# Patient Record
Sex: Female | Born: 1980 | Race: White | Hispanic: No | Marital: Married | Smoking: Former smoker
Health system: Southern US, Community
[De-identification: ages and names within clinical notes are randomized; demographics above are authoritative.]

## PROBLEM LIST (undated history)

## (undated) DIAGNOSIS — E039 Hypothyroidism, unspecified: Secondary | ICD-10-CM

## (undated) DIAGNOSIS — K219 Gastro-esophageal reflux disease without esophagitis: Secondary | ICD-10-CM

## (undated) DIAGNOSIS — K589 Irritable bowel syndrome without diarrhea: Secondary | ICD-10-CM

## (undated) DIAGNOSIS — E063 Autoimmune thyroiditis: Secondary | ICD-10-CM

## (undated) DIAGNOSIS — E288 Other ovarian dysfunction: Secondary | ICD-10-CM

## (undated) DIAGNOSIS — E042 Nontoxic multinodular goiter: Secondary | ICD-10-CM

## (undated) DIAGNOSIS — E538 Deficiency of other specified B group vitamins: Secondary | ICD-10-CM

## (undated) DIAGNOSIS — D649 Anemia, unspecified: Secondary | ICD-10-CM

## (undated) DIAGNOSIS — E559 Vitamin D deficiency, unspecified: Secondary | ICD-10-CM

## (undated) HISTORY — DX: Other ovarian dysfunction: E28.8

## (undated) HISTORY — PX: DILATION AND CURETTAGE OF UTERUS: SHX78

## (undated) HISTORY — PX: BUNIONECTOMY: SHX129

## (undated) HISTORY — PX: WISDOM TOOTH EXTRACTION: SHX21

## (undated) HISTORY — DX: Autoimmune thyroiditis: E06.3

## (undated) HISTORY — DX: Irritable bowel syndrome without diarrhea: K58.9

## (undated) HISTORY — DX: Nontoxic multinodular goiter: E04.2

## (undated) HISTORY — PX: DIAGNOSTIC LAPAROSCOPY: SUR761

## (undated) HISTORY — DX: Deficiency of other specified B group vitamins: E53.8

## (undated) HISTORY — PX: CHOLECYSTECTOMY: SHX55

## (undated) HISTORY — DX: Vitamin D deficiency, unspecified: E55.9

## (undated) HISTORY — DX: Morbid (severe) obesity due to excess calories: E66.01

---
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
## 1999-11-24 ENCOUNTER — Other Ambulatory Visit: Admission: RE | Admit: 1999-11-24 | Discharge: 1999-11-24 | Payer: Self-pay | Admitting: Obstetrics and Gynecology

## 2002-02-20 ENCOUNTER — Other Ambulatory Visit: Admission: RE | Admit: 2002-02-20 | Discharge: 2002-02-20 | Payer: Self-pay | Admitting: Obstetrics and Gynecology

## 2002-08-08 ENCOUNTER — Encounter: Admission: RE | Admit: 2002-08-08 | Discharge: 2002-08-08 | Payer: Self-pay | Admitting: Internal Medicine

## 2002-08-08 ENCOUNTER — Encounter (HOSPITAL_BASED_OUTPATIENT_CLINIC_OR_DEPARTMENT_OTHER): Payer: Self-pay | Admitting: Internal Medicine

## 2003-03-20 ENCOUNTER — Encounter: Admission: RE | Admit: 2003-03-20 | Discharge: 2003-03-20 | Payer: Self-pay | Admitting: *Deleted

## 2003-03-20 ENCOUNTER — Encounter: Payer: Self-pay | Admitting: *Deleted

## 2003-04-05 ENCOUNTER — Other Ambulatory Visit: Admission: RE | Admit: 2003-04-05 | Discharge: 2003-04-05 | Payer: Self-pay | Admitting: Obstetrics and Gynecology

## 2004-01-02 ENCOUNTER — Ambulatory Visit (HOSPITAL_COMMUNITY): Admission: RE | Admit: 2004-01-02 | Discharge: 2004-01-02 | Payer: Self-pay | Admitting: Internal Medicine

## 2004-01-21 ENCOUNTER — Encounter (INDEPENDENT_AMBULATORY_CARE_PROVIDER_SITE_OTHER): Payer: Self-pay | Admitting: Specialist

## 2004-01-21 ENCOUNTER — Ambulatory Visit (HOSPITAL_COMMUNITY): Admission: RE | Admit: 2004-01-21 | Discharge: 2004-01-21 | Payer: Self-pay | Admitting: General Surgery

## 2004-05-25 ENCOUNTER — Other Ambulatory Visit: Admission: RE | Admit: 2004-05-25 | Discharge: 2004-05-25 | Payer: Self-pay | Admitting: Obstetrics and Gynecology

## 2004-07-24 ENCOUNTER — Ambulatory Visit (HOSPITAL_COMMUNITY): Admission: RE | Admit: 2004-07-24 | Discharge: 2004-07-24 | Payer: Self-pay

## 2005-04-15 ENCOUNTER — Other Ambulatory Visit: Admission: RE | Admit: 2005-04-15 | Discharge: 2005-04-15 | Payer: Self-pay | Admitting: Obstetrics and Gynecology

## 2005-09-13 ENCOUNTER — Inpatient Hospital Stay (HOSPITAL_COMMUNITY): Admission: AD | Admit: 2005-09-13 | Discharge: 2005-09-13 | Payer: Self-pay | Admitting: Obstetrics and Gynecology

## 2005-09-20 ENCOUNTER — Encounter (INDEPENDENT_AMBULATORY_CARE_PROVIDER_SITE_OTHER): Payer: Self-pay | Admitting: *Deleted

## 2005-09-20 ENCOUNTER — Inpatient Hospital Stay (HOSPITAL_COMMUNITY): Admission: AD | Admit: 2005-09-20 | Discharge: 2005-09-23 | Payer: Self-pay | Admitting: Obstetrics and Gynecology

## 2006-02-22 ENCOUNTER — Emergency Department (HOSPITAL_COMMUNITY): Admission: EM | Admit: 2006-02-22 | Discharge: 2006-02-23 | Payer: Self-pay | Admitting: *Deleted

## 2006-12-09 ENCOUNTER — Other Ambulatory Visit: Admission: RE | Admit: 2006-12-09 | Discharge: 2006-12-09 | Payer: Self-pay | Admitting: Obstetrics and Gynecology

## 2007-07-16 ENCOUNTER — Encounter: Admission: RE | Admit: 2007-07-16 | Discharge: 2007-07-16 | Payer: Self-pay | Admitting: *Deleted

## 2007-10-27 ENCOUNTER — Ambulatory Visit (HOSPITAL_COMMUNITY): Admission: RE | Admit: 2007-10-27 | Discharge: 2007-10-27 | Payer: Self-pay | Admitting: Family Medicine

## 2007-12-04 ENCOUNTER — Other Ambulatory Visit: Admission: RE | Admit: 2007-12-04 | Discharge: 2007-12-04 | Payer: Self-pay | Admitting: Obstetrics and Gynecology

## 2008-06-24 ENCOUNTER — Inpatient Hospital Stay (HOSPITAL_COMMUNITY): Admission: EM | Admit: 2008-06-24 | Discharge: 2008-06-26 | Payer: Self-pay | Admitting: Obstetrics and Gynecology

## 2009-01-09 ENCOUNTER — Encounter: Admission: RE | Admit: 2009-01-09 | Discharge: 2009-01-09 | Payer: Self-pay | Admitting: Family Medicine

## 2011-02-03 ENCOUNTER — Other Ambulatory Visit: Payer: Self-pay | Admitting: Internal Medicine

## 2011-02-03 ENCOUNTER — Other Ambulatory Visit: Payer: Self-pay | Admitting: *Deleted

## 2011-02-03 DIAGNOSIS — E049 Nontoxic goiter, unspecified: Secondary | ICD-10-CM

## 2011-02-03 DIAGNOSIS — E063 Autoimmune thyroiditis: Secondary | ICD-10-CM

## 2011-02-03 DIAGNOSIS — E039 Hypothyroidism, unspecified: Secondary | ICD-10-CM

## 2011-02-11 ENCOUNTER — Other Ambulatory Visit: Payer: Self-pay | Admitting: Obstetrics and Gynecology

## 2011-02-11 ENCOUNTER — Other Ambulatory Visit (HOSPITAL_COMMUNITY)
Admission: RE | Admit: 2011-02-11 | Discharge: 2011-02-11 | Disposition: A | Discharge status: Home / Self Care | Payer: PRIVATE HEALTH INSURANCE | Source: Ambulatory Visit | Attending: Obstetrics and Gynecology | Admitting: Obstetrics and Gynecology

## 2011-02-11 DIAGNOSIS — Z01419 Encounter for gynecological examination (general) (routine) without abnormal findings: Secondary | ICD-10-CM | POA: Insufficient documentation

## 2011-02-19 ENCOUNTER — Ambulatory Visit
Admission: RE | Admit: 2011-02-19 | Discharge: 2011-02-19 | Disposition: A | Discharge status: Home / Self Care | Payer: PRIVATE HEALTH INSURANCE | Source: Ambulatory Visit | Attending: *Deleted | Admitting: *Deleted

## 2011-02-19 ENCOUNTER — Other Ambulatory Visit: Payer: Self-pay | Admitting: Internal Medicine

## 2011-02-19 DIAGNOSIS — E049 Nontoxic goiter, unspecified: Secondary | ICD-10-CM

## 2011-02-19 DIAGNOSIS — E039 Hypothyroidism, unspecified: Secondary | ICD-10-CM

## 2011-02-19 DIAGNOSIS — E063 Autoimmune thyroiditis: Secondary | ICD-10-CM

## 2011-02-22 ENCOUNTER — Other Ambulatory Visit: Payer: Self-pay | Admitting: Internal Medicine

## 2011-02-22 DIAGNOSIS — E063 Autoimmune thyroiditis: Secondary | ICD-10-CM

## 2011-02-22 DIAGNOSIS — E049 Nontoxic goiter, unspecified: Secondary | ICD-10-CM

## 2011-02-22 DIAGNOSIS — E039 Hypothyroidism, unspecified: Secondary | ICD-10-CM

## 2011-02-23 NOTE — Op Note (Signed)
NAMEASHRITHA, DESROSIERS NO.:  [id number]   MEDICAL RECORD NO.:  [id number]          PATIENT TYPE:  INP   LOCATION:  9118                          FACILITY:  [facility]   PHYSICIAN:  Charles A. Delcambre, MDDATE OF BIRTH:  [birth year]   DATE OF PROCEDURE:  06/24/2008  DATE OF DISCHARGE:                               OPERATIVE REPORT   PREOPERATIVE DIAGNOSES:  1. Intrauterine pregnancy at 39 weeks.  2. For a repeat cesarean section.  3. Right groin abscess.   POSTOPERATIVE DIAGNOSES:  1. Intrauterine pregnancy at 39 weeks.  2. For a repeat cesarean section  3. Right groin abscess.   PROCEDURE:  1. Repeat low-transverse cesarean section.  2. Incision and drainage and packing of vulvar abscess.   SURGEON:  Charles A. Sydnee Cabal, MD   ASSISTANT:  Gerald Leitz, MD   COMPLICATIONS:  None.   ESTIMATED BLOOD LOSS:  700 mL.   ANESTHESIA:  Spinal.   SPECIMENS:  Placenta to Labor and Delivery.   OPERATIVE FINDINGS:  Vigorous female, Apgars 9 and 9, 7 pounds and 7  ounces.   Instrument, sponge, and needle count correct x2.   DESCRIPTION OF PROCEDURE:  The patient was taken to the operating room,  placed in supine position after spinal was induced, and adequate sterile  prep and drape was undertaken, a Pfannenstiel incision was made with a  knife and carried down to fascia.  Fascia was incised with a knife and  Mayo scissors.  Rectus sheath was released superiorly and inferiorly,  sharply.  Peritoneum was entered.  During this procedure, there was no  damage to bowel, bladder, or vascular structures.  A small omental  adhesion at the superior edge of the fascial dissection was taken down  with bipolar cautery, and this released the minimal adhesion without  difficulty and with good hemostasis.  Rectus sheath was taken down  inferiorly and bladder was taken down sharply with a bladder blade in  place.  There was no evidence of injury to the bladder.  Bladder flap  was  then mobilized as noted and Alexis retractor was placed and was  swept.  No bowel was caught.  Surgery now went well and adequate  exposure was noted.  Lower uterine segment transverse incision was made  with a knife to amniotomy.  There was no damage to the infant.  Traction  was used to extend the incision.  Clear amniotic fluid was noted.  Vertex was lifted to the uterine incision.  Fundal pressure by the  operator assistant delivered the baby without difficulty.  The infant  was vigorous with findings as noted above.  The cord was cut and the  infant was shown to the parents and then taken to the neonatologist, who  was in attendance.  Placenta was manually extracted secondary to some  mild adhesions.  This was sent to [facility] and then to Labor  and Delivery.  Placental site was clean and of good hemostasis.  Internal surface of the uterus was wiped with a moistened lap.  Uterus  was then closed in 2 layers, #1 chromic running-locking, then #1  chromic  imbricating for most part of the case and locking 2 figure-of-eight  sutures were used in the midline to achieve hemostasis, figure-of-eight  of 2-0 Vicryl was used, and final hemostasis was excellent.  Irrigation  was carried out.  Hemostasis was verified.  Subfascial hemostasis was  excellent after removal of the Alexis retractor.  Fascia was then closed  with a running-nonlocking suture, subcutaneous 2-0 Vicryl was used to  close the subcutaneous layer after irrigation with minor electrocautery,  sterile skin clips were placed, sterile dressing was applied, drapes  were removed, and the area of the abscess and the groin were then  isolated with sterile towels.  This had been prepped earlier and was re-  prepped with Betadine.  I would treat her with Bactrim postoperatively  presumably for MRSA, although culture was not done.  MRSA would be  treated with this regimen as well, and we will keep her on Bactrim for 2  weeks.   Incision was made to open about approximately three-quarters of  an inch.  Abscess was about 2 cm deep.  Loculations were broken up with  a hemostat.  An half-inch plain gauze was then placed in packing.  We  will change the packing once a day until healed primarily.  The patient  is informed.  Dressing was applied over the gauze.  She tolerated the  procedure well and was taken to recovery with the physician in  attendance.      Charles A. Sydnee Cabal, MD  Electronically Signed     CAD/MEDQ  D:  06/24/2008  T:  06/25/2008  Job:  [id number]

## 2011-02-23 NOTE — H&P (Signed)
[name], FILLINGIM NO.:  [id number]   MEDICAL RECORD NO.:  [id number]          PATIENT TYPE:  INP   LOCATION:                                [facility]  [facility]   PHYSICIAN:  Charles A. Delcambre, MDDATE OF BIRTH:  1980-12-23   DATE OF ADMISSION:  07/05/2008  DATE OF DISCHARGE:                              HISTORY & PHYSICAL   This patient will be admitted on June 25, 2008, to undergo repeat  cesarean section.  [facility] July 01, 2008.  She will be admitted on  June 24, 2008, as noted.  Her pregnancy has been complicated by  hypothyroidism.  She is gravida 4, para 1-0-2-1, 1 ectopic.   PAST MEDICAL HISTORY:  Hashimoto thyroiditis, irritable bowel syndrome,  currently only hypothyroidism.   SURGICAL HISTORY:  Cholecystectomy, bunionectomy, D&C for miscarriage at  8 weeks, cesarean section low transverse.   MEDICATIONS:  Synthroid 50 mcg has been discontinued during the  pregnancy.  Followup TSH was normal.   ALLERGIES:  PENICILLIN, reaction not specified.   SOCIAL HISTORY:  No tobacco, ethanol, or drug use.  She is monogamous  with her husband.   FAMILY HISTORY:  Ovarian cancer in maternal grandmother, paternal  grandfather with colon cancer, otherwise no major illnesses.   REVIEW OF SYSTEMS:  Notes active fetal movement.  Denies fever, chills,  nausea, vomiting, diarrhea, constipation, rupture of membranes,  bleeding, or current contractions.   PHYSICAL EXAMINATION:  GENERAL:  Alert and oriented x3.  VITAL SIGNS:  Blood pressure 110/78, respirations 18, pulse 90, weight  202 pounds, and height 5 feet and 6-3/4 inches.  LUNGS:  Clear bilaterally.  HEART:  Regular rate and rhythm, 2/6 systolic ejection murmur at left  sternal border.  ABDOMEN:  Gravid.  Fundal height 38 cm today at date of dictation which  is June 10, 2008.  PELVIC:  Deferred.  EXTREMITIES:  Nontender.   ASSESSMENT:  Intrauterine pregnancy to be 39 weeks and 1 day upon  admission for repeat cesarean section.  She gives informed consent as  noted above.  All questions were answered.  All questions were answered.  We will proceed as outlined.  Preoperative CBC, TSH, type and screen,  and RPR.  Group B strep has been done is pending at  this time.  Questions were answered.  We will proceed as outlined.  She  accepts risks of infection, bleeding, bowel and bladder damage, blood  product risk including hepatitis and HIV exposure, DVT, incision  infection, ureteral damage.  Gives informed consent.      Charles A. Sydnee Cabal, MD  Electronically Signed     CAD/MEDQ  D:  06/10/2008  T:  06/11/2008  Job:  [id number]

## 2011-02-26 NOTE — Op Note (Signed)
[name], [name]                       ACCOUNT NO.:  [id number]   MEDICAL RECORD NO.:  [id number]                   PATIENT TYPE:  AMB   LOCATION:  DAY                                  FACILITY:  [facility]   PHYSICIAN:  Timothy E. Earlene Plater, M.D.              DATE OF BIRTH:  [birth year]   DATE OF PROCEDURE:  01/21/2004  DATE OF DISCHARGE:  01/21/2004                                 OPERATIVE REPORT   PREOPERATIVE DIAGNOSIS:  Cholecystolithiasis.   POSTOPERATIVE DIAGNOSIS:  Cholecystolithiasis.   PROCEDURE:  Laparoscopic cholecystectomy and operative cholangiogram.   SURGEON:  Kendrick Ranch, M.D.   ASSISTANT:  Thornton Dales, M.D.   ANESTHESIA:  General.   INDICATIONS FOR PROCEDURE:  [name] is otherwise healthy, 23, newly  diagnosed after her first acute attack of cholecystolithiasis.  She has been  well-prepared and is ready to proceed.  She is seen, identified, and the  permit signed.  Laboratory data are satisfactorily normal.   DESCRIPTION OF PROCEDURE:  She was taken to the operating room, placed  supine, general endotracheal anesthesia administered.  PAS hose, oral  gastric tube, appropriate monitors applied.  The abdomen was prepped and  draped in the usual fashion.  And prior to each incision, 0.25% Marcaine  with epinephrine is used.  A vertical infraumbilical incision made, the  fascia identified, opened vertically, peritoneum entered without  complications.  Hasson catheter placed, tied in place with a #1 Vicryl.  The  abdomen insufflated, and general peritoneoscopy was unremarkable.  A second  10 mm trocar placed in the mid epigastrium and two 5 mm trocars in the right  upper quadrant.  The gallbladder was normal in its overall appearance.  It  was grasped, placed on tension.  Careful dissection at the base of the  gallbladder revealed a normal-appearing cystic duct entering the  gallbladder.  This was dissected free as was the artery just anterior and a  complete  window created.  Once this had been done, a clip was placed near  the gallbladder on the cystic duct.  Then, an incision was made in the  cystic duct and the cholangiogram catheter was placed percutaneously and  placed into the cystic duct.  Cholangiography carried out with real-time  fluoroscopy and appeared normal.  The clip and the catheter removed, the  stump of the cystic duct triply clipped and divided.  The artery triply  clipped and divided.  The gallbladder was removed from the gallbladder bed  without incident or complication.  The gallbladder bed was dry.  Irrigant  was clear.  The gallbladder removed through the infraumbilical incision  which was closed and tied with a #1 Vicryl with an additional stitch placed  under direct vision.  Further irrigation carried out and was clear and all  irrigant, CO2, instruments, and trocars removed.  Counts were correct.  She  had tolerated it well.  The skin incisions were inspected and closed  with 4-  0 Monocryl.  Steri-Strips applied, dry sterile dressing.  Final counts  correct.  She was awakened and taken to the recovery room in good condition.                                              Timothy E. Earlene Plater, M.D.   TED/MEDQ  D:  01/23/2004  T:  01/23/2004  Job:  [id number]

## 2011-02-26 NOTE — Discharge Summary (Signed)
NAMEHENRITTA, [name] NO.:  [id number]   MEDICAL RECORD NO.:  [id number]          PATIENT TYPE:  INP   LOCATION:  9118                          [facility]ACILITY[facility]  [facility]   PHYSICIAN:  Charles A. Delcambre, MDDATE OF BIRTH:  07-13-1981   DATE OF ADMISSION:  06/24/2008  DATE OF DISCHARGE:  06/26/2008                               DISCHARGE SUMMARY   DISCHARGE DIAGNOSES:  1. Intrauterine pregnancy at 39 weeks.  2. Repeat cesarean section.  3. Right groin boil/abscess.   PROCEDURES:  1. Repeat low transverse cesarean section.  2. Incision and drainage and packing of the right bulbar boil/abscess.   FINDINGS:  Vigorous female, Apgars 9 and 9, and 7 pounds 7 ounces.   HISTORY AND PHYSICAL:  As dictated on the chart.   LABORATORY DATA:  Postoperative hematocrit 28.7, hemoglobin 9.9.  Culture of the abscess was not done and that we would empirically treat  with Bactrim to cover MRSA as it will be found in approximately 30% of  vulvar abscess as per literature.   PATHOLOGY:  Placenta to labor and delivery.   DISPOSITION:  The patient was discharged home to follow up in the office  in 3 days to undergo a staple discontinuation.  She was given Percocet  5/325 one to two p.o. q.4 h. p.r.n. #40 and Motrin 800 mg 1 p.o. q.8 h.  p.r.n. #30 refill x1.  She was given convalescent instructions.  No  driving for 2 weeks.  Shower okay for 2 weeks.  No bath for 2 weeks.  No  vaginal intercourse for 6 weeks.  No lifting greater than 20 pounds for  4 weeks.  Notify if temperature greater than 100, increasing pain, or  bleeding.  She was given a prescription of Bactrim DS 1 p.o. b.i.d. for  2 weeks for 21 days as well.   HOSPITAL COURSE:  The patient was admitted and underwent surgery as  noted above.  On postop day #1 she did well with Duramorph and went to  pain medication p.o.  She had spontaneous flatus return without  difficulty.  The abscess incision was packed by the nursing  personnel on  postop day #2.  Packing was accomplished as well and she strongly  desired to be discharged to home and was discharged to home for that  reason on postop day #2.      Charles A. Sydnee Cabal, MD  Electronically Signed     CAD/MEDQ  D:  07/18/2008  T:  07/19/2008  Job:  [id number]

## 2011-02-26 NOTE — H&P (Signed)
[name], AILES NO.:  [id number]   MEDICAL RECORD NO.:  [id number]           PATIENT TYPE:   LOCATION:                                 FACILITY:   PHYSICIAN:  Charles A. Delcambre, MDDATE OF BIRTH:  February 03, 1981   DATE OF ADMISSION:  09/20/2005  DATE OF DISCHARGE:                                HISTORY & PHYSICAL   She is a 29 year old para 0-0-1-0, [facility] September 12, 2005 at 41 weeks and 1  day estimated gestational age to be induced on the 11th.  Pregnancy has been  uncomplicated except for GERD for which she is on Protonix.  Pregnancy is  documented by LMP, but confirmed with pregnancy ultrasound early in the  pregnancy at 8 weeks.  With discussion with the patient feeling best  estimate of date of confinement September 12, 2005.   PAST MEDICAL HISTORY:  1.  History of Hashimoto's thyroiditis.  2.  Irritable bowel syndrome.   PAST SURGICAL HISTORY:  1.  Cholecystectomy.  2.  Bunionectomy.  3.  D&C for miscarriage 8 weeks.   MEDICATIONS:  Protonix, prenatal vitamins.   ALLERGIES:  PENICILLIN, reaction not specified.   SOCIAL HISTORY:  No tobacco, ethanol, or drug use.  Patient is married, in  monogamous relationship with her husband.   FAMILY HISTORY:  Maternal grandmother ovarian cancer.  Maternal grandfather  colon cancer.  Otherwise, no major medical illnesses.   REVIEW OF SYSTEMS:  Active fetal movement.  No ruptured membranes or  bleeding.  Nausea, vomiting, constipation, bleeding, dysuria.   PHYSICAL EXAMINATION:  GENERAL:  Alert and oriented x3.  VITAL SIGNS:  Blood pressure 122/70, weight 205 pounds.  HEENT:  Grossly within normal limits.  NECK:  Supple without thyromegaly or adenopathy.  BACK:  No CVAT.  LUNGS:  Clear bilaterally.  HEART:  Regular rate and rhythm.  2/6 systolic ejection murmur left sternal  border.  BREASTS:  No masses, tenderness, discharge, skin or nipple change  bilaterally.  ABDOMEN:  Gravid.  Fundal height 39 cm.   Estimated fetal weight 3800 g.  Vertex to Leopold's.  Fetal heart rate 150s.  PELVIC:  Normal external female genitalia.  Bartholin's, urethral, Skene's  within normal limits.  Vault clean.  Cervix anterior, 3 cm dilated, 75%  effaced, -1 station, vertex intact.  Membranes are stripped at patient  request.  EXTREMITIES:  Minimal edema bilaterally, nontender bilaterally.   PRENATAL LABORATORIES:  28-week hemoglobin at 12.5, one-hour Glucola 69.  36-  week group B Strep negative.  Blood type O+.  Antibody screen negative.  VDRL negative.  Rubella immune.  Hepatitis B surface antigen negative.  HIV  nonreactive.  TSH normal.  GC, Chlamydia cultures negative.  Pap negative.  Cystic fibrosis negative.   ASSESSMENT:  Intrauterine pregnancy 41 weeks 1 day.   PLAN:  Induction on September 20, 2005.  Will plan Pitocin induction with  artificial rupture of membranes.  Patient is in agreement and will follow up  as directed.      Charles A. Sydnee Cabal, MD  Electronically Signed     CAD/MEDQ  D:  09/16/2005  T:  09/16/2005  Job:  [id number]

## 2011-02-26 NOTE — Op Note (Signed)
NAMEJAYLAH, [name] NO.:  [id number]   MEDICAL RECORD NO.:  [id number]          PATIENT TYPE:  INP   LOCATION:  9103                          FACILITY:  [facility]   PHYSICIAN:  Charles A. Delcambre, MDDATE OF BIRTH:  September 06, 1981   DATE OF PROCEDURE:  09/20/2005  DATE OF DISCHARGE:                                 OPERATIVE REPORT   PREOPERATIVE DIAGNOSES:  1.  Intrauterine pregnancy 41 weeks and 1 day.  2.  Fetal intolerance of labor in the second stage.  3.  Meconium.   POSTOPERATIVE DIAGNOSES:  1.  Intrauterine pregnancy 41 weeks and 1 day.  2.  Fetal intolerance of labor in the second stage.  3.  Meconium.   OPERATION/PROCEDURE:  Primary low transverse cesarean section.   SURGEON:  Charles A. Sydnee Cabal, M.D.   ASSISTANT:  None.   COMPLICATIONS:  Meconium.   ANESTHESIA:  Epidural.   SPECIMENS:  Placenta to pathology.  Cord arterial blood gas pH 7.16.  Venous  blood gas from the cord pH 7.23.  Vigorous female, Apgars 9 and 9, 8 pounds  0 ounces.   ESTIMATED BLOOD LOSS:  500 mL.   COUNTS:  Instrument, sponge and needle counts correct x2.   DESCRIPTION OF PROCEDURE:  The patient was taken to the operating room,  placed in the supine position.  Epidural was dosed and it was found to be  adequate.  Sterile prep and drape was undertaken.  A Pfannenstiel incision  was made with the knife and carried down to the fascia.  The fascia was  incised with the knife and Mayo scissors.  Rectus sheath was released  superiorly and inferiorly sharply.  Rectus muscle was sharply dissected in  the midline.  Peritoneum was entered with hemostat.  Traction was used to  extend this incision.  Vesicouterine peritoneum was incised.  Blunt  dissection was used to develop the bladder flap.  Bladder blade was placed.  Lower uterine segment transverse incision was taken down.  Amniotomy was  made with the knife.  There  was no damage to the infant.  Traction was used  to extend  the incision.  Hand was inserted.  Occiput was lifted through the  incision uterine incision site.  Fundal pressure was applied by the operator  and assistant and infant was delivered without difficulty.  DeLee suction  was applied with delivery of the head.  The infant was vigorous and cried  immediately after delivery of the shoulders.  Further suction was done with  DeLee and bulb suction.  The infant was shown to the parents after the cord  was cut and given to the neonatologist who was present.  Cord gases were  taken.  Cord blood was taken.  Uterus was massaged and placenta was manually  expressed and handed off to go to pathology and for cord blood registry to  be taken per the patient's request.   Uterus was externalized for repair.  Inside the uterus was wiped with a  moist lap.  Uterus was then closed in running locking single layer closure  of #1 chromic. There was one area  of bleeding in the midline and this was  closed further with #1 chromic figure-of-eight for good hemostasis  resulting.  Irrigation was carried out.  Uterus was reinternalized.  Pericolic gutters and bladder flap areas were irrigated and cleansed of  blood and clots.  Hemostasis was verified at the uterine incision site and  the bladder flap.  Hemostasis was excellent.  Subfascial hemostasis was  excellent.  Fascia was then closed with #1 Vicryl running, non-locking  suture.  Subcutaneous 2-0 plain gut interrupted sutures to close the  subcutaneous layer.  Sterile skin clips were used to close the skin.  Sterile dressing was applied.  The patient was taken to recovery with the  physician in attendance having tolerated the procedure well.      Charles A. Sydnee Cabal, MD  Electronically Signed     CAD/MEDQ  D:  09/20/2005  T:  09/21/2005  Job:  [id number]

## 2011-07-12 LAB — CBC
HCT: 28.7 — ABNORMAL LOW
Hemoglobin: 9.9 — ABNORMAL LOW
MCHC: 34.6
MCV: 95.9
WBC: 12.4 — ABNORMAL HIGH

## 2011-07-14 LAB — CBC
Hemoglobin: 12.3
MCHC: 34.3
Platelets: 229
WBC: 10.8 — ABNORMAL HIGH

## 2011-07-14 LAB — RPR: RPR Ser Ql: NONREACTIVE

## 2011-07-14 LAB — ABO/RH: ABO/RH(D): O POS

## 2013-03-15 ENCOUNTER — Other Ambulatory Visit (HOSPITAL_COMMUNITY)
Admission: RE | Admit: 2013-03-15 | Discharge: 2013-03-15 | Disposition: A | Discharge status: Home / Self Care | Payer: BC Managed Care – PPO | Source: Ambulatory Visit | Attending: Obstetrics and Gynecology | Admitting: Obstetrics and Gynecology

## 2013-03-15 ENCOUNTER — Other Ambulatory Visit: Payer: Self-pay | Admitting: Obstetrics and Gynecology

## 2013-03-15 DIAGNOSIS — Z01419 Encounter for gynecological examination (general) (routine) without abnormal findings: Secondary | ICD-10-CM | POA: Insufficient documentation

## 2013-03-15 DIAGNOSIS — Z113 Encounter for screening for infections with a predominantly sexual mode of transmission: Secondary | ICD-10-CM | POA: Insufficient documentation

## 2013-03-15 DIAGNOSIS — Z1151 Encounter for screening for human papillomavirus (HPV): Secondary | ICD-10-CM | POA: Insufficient documentation

## 2013-03-15 LAB — OB RESULTS CONSOLE RPR: RPR: NONREACTIVE

## 2013-03-15 LAB — OB RESULTS CONSOLE ABO/RH: RH TYPE: POSITIVE

## 2013-03-15 LAB — OB RESULTS CONSOLE ANTIBODY SCREEN: Antibody Screen: NEGATIVE

## 2013-04-26 LAB — OB RESULTS CONSOLE HIV ANTIBODY (ROUTINE TESTING): HIV: NONREACTIVE

## 2013-10-22 LAB — OB RESULTS CONSOLE GBS: STREP GROUP B AG: NEGATIVE

## 2013-10-30 ENCOUNTER — Encounter (HOSPITAL_COMMUNITY): Payer: Self-pay | Admitting: Pharmacist

## 2013-11-08 ENCOUNTER — Encounter (HOSPITAL_COMMUNITY): Payer: Self-pay

## 2013-11-09 ENCOUNTER — Other Ambulatory Visit: Payer: Self-pay | Admitting: Obstetrics and Gynecology

## 2013-11-12 ENCOUNTER — Encounter (HOSPITAL_COMMUNITY): Payer: Self-pay

## 2013-11-12 ENCOUNTER — Other Ambulatory Visit: Payer: Self-pay | Admitting: Obstetrics and Gynecology

## 2013-11-12 ENCOUNTER — Encounter (HOSPITAL_COMMUNITY)
Admission: RE | Admit: 2013-11-12 | Discharge: 2013-11-12 | Disposition: A | Discharge status: Home / Self Care | Payer: BC Managed Care – PPO | Source: Ambulatory Visit | Attending: Obstetrics and Gynecology | Admitting: Obstetrics and Gynecology

## 2013-11-12 ENCOUNTER — Encounter (HOSPITAL_COMMUNITY): Payer: Self-pay | Admitting: Anesthesiology

## 2013-11-12 HISTORY — DX: Gastro-esophageal reflux disease without esophagitis: K21.9

## 2013-11-12 HISTORY — DX: Hypothyroidism, unspecified: E03.9

## 2013-11-12 HISTORY — DX: Anemia, unspecified: D64.9

## 2013-11-12 LAB — CBC
HCT: 35 % — ABNORMAL LOW (ref 36.0–46.0)
Hemoglobin: 11.6 g/dL — ABNORMAL LOW (ref 12.0–15.0)
MCH: 31.3 pg (ref 26.0–34.0)
MCHC: 33.1 g/dL (ref 30.0–36.0)
MCV: 94.3 fL (ref 78.0–100.0)
PLATELETS: 226 10*3/uL (ref 150–400)
RBC: 3.71 MIL/uL — ABNORMAL LOW (ref 3.87–5.11)
RDW: 15.5 % (ref 11.5–15.5)
WBC: 9.8 10*3/uL (ref 4.0–10.5)

## 2013-11-12 LAB — RPR: RPR Ser Ql: NONREACTIVE

## 2013-11-12 MED ORDER — GENTAMICIN SULFATE 40 MG/ML IJ SOLN
INTRAMUSCULAR | Status: AC
  Administered 2013-11-13: 100 mL via INTRAVENOUS
  Filled 2013-11-12: qty 10.5

## 2013-11-12 NOTE — Patient Instructions (Addendum)
   Your procedure is scheduled on: Tuesday, Feb 3  [facility] at:  6 AM Pick up the phone at the desk and dial [phone] and inform us of your arrival.  Please call this number if you have any problems the morning of surgery: [phone]  Remember: Do not eat or drink after midnight: Monday Take these medicines the morning of surgery with a SIP OF WATER: synthroid, nexium  Do not wear jewelry, make-up, or FINGER nail polish No metal in your hair or on your body. Do not wear lotions, powders, perfumes.  You may wear deodorant.  Do not bring valuables to the hospital. Contacts, dentures or bridgework may not be worn into surgery.  Leave suitcase in the car. After Surgery it may be brought to your room. For patients being admitted to the hospital, checkout time is 11:00am the day of discharge.  Home with husband Tiffany [name] cell [phone]

## 2013-11-12 NOTE — Anesthesia Preprocedure Evaluation (Addendum)
Anesthesia Evaluation  Patient identified by MRN, date of birth, ID band Patient awake    Reviewed: Allergy & Precautions, H&P , NPO status , Patient's Chart, lab work & pertinent test results  Airway Mallampati: II TM Distance: >3 FB Neck ROM: Full    Dental no notable dental hx. (+) Teeth Intact   Pulmonary former smoker,  breath sounds clear to auscultation  Pulmonary exam normal       Cardiovascular negative cardio ROS  Rhythm:Regular Rate:Normal - Carotid Bruit    Neuro/Psych negative neurological ROS  negative psych ROS   GI/Hepatic Neg liver ROS, GERD-  Medicated and Controlled,  Endo/Other  Hypothyroidism Morbid obesity  Renal/GU negative Renal ROS  negative genitourinary   Musculoskeletal negative musculoskeletal ROS (+)   Abdominal (+) + obese,   Peds  Hematology  (+) anemia ,   Anesthesia Other Findings   Reproductive/Obstetrics (+) Pregnancy Previous C/Section                          Anesthesia Physical Anesthesia Plan  ASA: III  Anesthesia Plan: Spinal   Post-op Pain Management:    Induction:   Airway Management Planned: Natural Airway  Additional Equipment:   Intra-op Plan:   Post-operative Plan:   Informed Consent: I have reviewed the patients History and Physical, chart, labs and discussed the procedure including the risks, benefits and alternatives for the proposed anesthesia with the patient or authorized representative who has indicated his/her understanding and acceptance.     Plan Discussed with: CRNA, Anesthesiologist and Surgeon  Anesthesia Plan Comments:         Anesthesia Quick Evaluation

## 2013-11-13 ENCOUNTER — Encounter (HOSPITAL_COMMUNITY): Payer: BC Managed Care – PPO | Admitting: Anesthesiology

## 2013-11-13 ENCOUNTER — Encounter (HOSPITAL_COMMUNITY): Payer: Self-pay | Admitting: *Deleted

## 2013-11-13 ENCOUNTER — Encounter (HOSPITAL_COMMUNITY)
Admission: RE | Disposition: A | Discharge status: Home / Self Care | Payer: Self-pay | Source: Ambulatory Visit | Attending: Obstetrics and Gynecology

## 2013-11-13 ENCOUNTER — Inpatient Hospital Stay (HOSPITAL_COMMUNITY)
Admission: AD | Admit: 2013-11-13 | Payer: PRIVATE HEALTH INSURANCE | Source: Ambulatory Visit | Admitting: Obstetrics and Gynecology

## 2013-11-13 ENCOUNTER — Inpatient Hospital Stay (HOSPITAL_COMMUNITY): Payer: BC Managed Care – PPO | Admitting: Anesthesiology

## 2013-11-13 ENCOUNTER — Inpatient Hospital Stay (HOSPITAL_COMMUNITY)
Admission: RE | Admit: 2013-11-13 | Discharge: 2013-11-15 | DRG: 766 | Disposition: A | Discharge status: Home / Self Care | Payer: BC Managed Care – PPO | Source: Ambulatory Visit | Attending: Obstetrics and Gynecology | Admitting: Obstetrics and Gynecology

## 2013-11-13 DIAGNOSIS — Z87891 Personal history of nicotine dependence: Secondary | ICD-10-CM

## 2013-11-13 DIAGNOSIS — O34219 Maternal care for unspecified type scar from previous cesarean delivery: Principal | ICD-10-CM | POA: Diagnosis present

## 2013-11-13 DIAGNOSIS — K219 Gastro-esophageal reflux disease without esophagitis: Secondary | ICD-10-CM | POA: Diagnosis present

## 2013-11-13 DIAGNOSIS — E039 Hypothyroidism, unspecified: Secondary | ICD-10-CM | POA: Diagnosis present

## 2013-11-13 DIAGNOSIS — O99214 Obesity complicating childbirth: Secondary | ICD-10-CM

## 2013-11-13 DIAGNOSIS — D649 Anemia, unspecified: Secondary | ICD-10-CM | POA: Diagnosis present

## 2013-11-13 DIAGNOSIS — O9902 Anemia complicating childbirth: Secondary | ICD-10-CM | POA: Diagnosis present

## 2013-11-13 DIAGNOSIS — E669 Obesity, unspecified: Secondary | ICD-10-CM | POA: Diagnosis present

## 2013-11-13 DIAGNOSIS — Z98891 History of uterine scar from previous surgery: Secondary | ICD-10-CM

## 2013-11-13 DIAGNOSIS — O99284 Endocrine, nutritional and metabolic diseases complicating childbirth: Secondary | ICD-10-CM

## 2013-11-13 DIAGNOSIS — E079 Disorder of thyroid, unspecified: Secondary | ICD-10-CM | POA: Diagnosis present

## 2013-11-13 DIAGNOSIS — Z302 Encounter for sterilization: Secondary | ICD-10-CM

## 2013-11-13 LAB — PREPARE RBC (CROSSMATCH)

## 2013-11-13 SURGERY — Surgical Case
Anesthesia: Spinal | Site: Abdomen | Laterality: Bilateral

## 2013-11-13 MED ORDER — SODIUM CHLORIDE 0.9 % IJ SOLN: 3.0000 mL | INTRAMUSCULAR | Status: DC | PRN

## 2013-11-13 MED ORDER — SIMETHICONE 80 MG PO CHEW
80.0000 mg | CHEWABLE_TABLET | ORAL | Status: DC
  Administered 2013-11-15: 80 mg via ORAL
  Filled 2013-11-13 (×2): qty 1

## 2013-11-13 MED ORDER — NALBUPHINE HCL 10 MG/ML IJ SOLN: 5.0000 mg | INTRAMUSCULAR | Status: DC | PRN

## 2013-11-13 MED ORDER — SCOPOLAMINE 1 MG/3DAYS TD PT72
MEDICATED_PATCH | TRANSDERMAL | Status: DC
Start: 2013-11-13 — End: 2013-11-15
  Administered 2013-11-13: 1.5 mg via TRANSDERMAL
  Filled 2013-11-13: qty 1

## 2013-11-13 MED ORDER — MORPHINE SULFATE 0.5 MG/ML IJ SOLN
INTRAMUSCULAR | Status: AC
  Filled 2013-11-13: qty 10

## 2013-11-13 MED ORDER — PANTOPRAZOLE SODIUM 40 MG PO TBEC
40.0000 mg | DELAYED_RELEASE_TABLET | Freq: Every day | ORAL | Status: DC
  Administered 2013-11-14 – 2013-11-15 (×2): 40 mg via ORAL
  Filled 2013-11-13 (×3): qty 1

## 2013-11-13 MED ORDER — KETOROLAC TROMETHAMINE 30 MG/ML IJ SOLN
30.0000 mg | Freq: Four times a day (QID) | INTRAMUSCULAR | Status: AC | PRN
  Administered 2013-11-13: 30 mg via INTRAVENOUS
  Filled 2013-11-13: qty 1

## 2013-11-13 MED ORDER — NALOXONE HCL 1 MG/ML IJ SOLN
1.0000 ug/kg/h | INTRAVENOUS | Status: DC | PRN
  Filled 2013-11-13: qty 2

## 2013-11-13 MED ORDER — OXYTOCIN 10 UNIT/ML IJ SOLN
INTRAMUSCULAR | Status: AC
  Filled 2013-11-13: qty 4

## 2013-11-13 MED ORDER — ONDANSETRON HCL 4 MG/2ML IJ SOLN
INTRAMUSCULAR | Status: DC | PRN
  Administered 2013-11-13: 4 mg via INTRAVENOUS

## 2013-11-13 MED ORDER — DIPHENHYDRAMINE HCL 50 MG/ML IJ SOLN: 25.0000 mg | INTRAMUSCULAR | Status: DC | PRN

## 2013-11-13 MED ORDER — ZOLPIDEM TARTRATE 5 MG PO TABS: 5.0000 mg | ORAL_TABLET | Freq: Every evening | ORAL | Status: DC | PRN

## 2013-11-13 MED ORDER — OXYTOCIN 10 UNIT/ML IJ SOLN
40.0000 [IU] | INTRAVENOUS | Status: DC | PRN
  Administered 2013-11-13: 40 [IU] via INTRAVENOUS

## 2013-11-13 MED ORDER — OXYTOCIN 40 UNITS IN LACTATED RINGERS INFUSION - SIMPLE MED: 62.5000 mL/h | INTRAVENOUS | Status: AC

## 2013-11-13 MED ORDER — LACTATED RINGERS IV SOLN
INTRAVENOUS | Status: DC
  Administered 2013-11-13 (×3): via INTRAVENOUS

## 2013-11-13 MED ORDER — METHYLERGONOVINE MALEATE 0.2 MG/ML IJ SOLN: 0.2000 mg | INTRAMUSCULAR | Status: DC | PRN

## 2013-11-13 MED ORDER — ONDANSETRON HCL 4 MG PO TABS: 4.0000 mg | ORAL_TABLET | ORAL | Status: DC | PRN

## 2013-11-13 MED ORDER — LEVOTHYROXINE SODIUM 75 MCG PO TABS
75.0000 ug | ORAL_TABLET | Freq: Every day | ORAL | Status: DC
  Administered 2013-11-14 – 2013-11-15 (×2): 75 ug via ORAL
  Filled 2013-11-13 (×2): qty 1

## 2013-11-13 MED ORDER — LANOLIN HYDROUS EX OINT: 1.0000 "application " | TOPICAL_OINTMENT | CUTANEOUS | Status: DC | PRN

## 2013-11-13 MED ORDER — PHENYLEPHRINE 8 MG IN D5W 100 ML (0.08MG/ML) PREMIX OPTIME
INJECTION | INTRAVENOUS | Status: DC | PRN
  Administered 2013-11-13: 60 ug/min via INTRAVENOUS

## 2013-11-13 MED ORDER — IBUPROFEN 600 MG PO TABS
600.0000 mg | ORAL_TABLET | Freq: Four times a day (QID) | ORAL | Status: DC
  Administered 2013-11-14 – 2013-11-15 (×6): 600 mg via ORAL
  Filled 2013-11-13 (×6): qty 1

## 2013-11-13 MED ORDER — SENNOSIDES-DOCUSATE SODIUM 8.6-50 MG PO TABS
2.0000 | ORAL_TABLET | ORAL | Status: DC
  Administered 2013-11-14 – 2013-11-15 (×2): 2 via ORAL
  Filled 2013-11-13 (×2): qty 2

## 2013-11-13 MED ORDER — NALOXONE HCL 0.4 MG/ML IJ SOLN: 0.4000 mg | INTRAMUSCULAR | Status: DC | PRN

## 2013-11-13 MED ORDER — 0.9 % SODIUM CHLORIDE (POUR BTL) OPTIME
TOPICAL | Status: DC | PRN
  Administered 2013-11-13: 1000 mL

## 2013-11-13 MED ORDER — ONDANSETRON HCL 4 MG/2ML IJ SOLN: 4.0000 mg | Freq: Three times a day (TID) | INTRAMUSCULAR | Status: DC | PRN

## 2013-11-13 MED ORDER — FENTANYL CITRATE 0.05 MG/ML IJ SOLN
INTRAMUSCULAR | Status: DC | PRN
  Administered 2013-11-13: 25 ug via INTRATHECAL

## 2013-11-13 MED ORDER — OXYCODONE-ACETAMINOPHEN 5-325 MG PO TABS: 1.0000 | ORAL_TABLET | ORAL | Status: DC | PRN

## 2013-11-13 MED ORDER — SIMETHICONE 80 MG PO CHEW
80.0000 mg | CHEWABLE_TABLET | ORAL | Status: DC | PRN
  Administered 2013-11-14: 80 mg via ORAL

## 2013-11-13 MED ORDER — PRENATAL MULTIVITAMIN CH
1.0000 | ORAL_TABLET | Freq: Every day | ORAL | Status: DC
  Administered 2013-11-13 – 2013-11-14 (×2): 1 via ORAL
  Filled 2013-11-13 (×2): qty 1

## 2013-11-13 MED ORDER — KETOROLAC TROMETHAMINE 30 MG/ML IJ SOLN
30.0000 mg | Freq: Four times a day (QID) | INTRAMUSCULAR | Status: AC | PRN
  Administered 2013-11-13: 30 mg via INTRAMUSCULAR

## 2013-11-13 MED ORDER — DIPHENHYDRAMINE HCL 25 MG PO CAPS: 25.0000 mg | ORAL_CAPSULE | ORAL | Status: DC | PRN

## 2013-11-13 MED ORDER — PHENYLEPHRINE 8 MG IN D5W 100 ML (0.08MG/ML) PREMIX OPTIME
INJECTION | INTRAVENOUS | Status: AC
  Filled 2013-11-13: qty 100

## 2013-11-13 MED ORDER — DIBUCAINE 1 % RE OINT: 1.0000 "application " | TOPICAL_OINTMENT | RECTAL | Status: DC | PRN

## 2013-11-13 MED ORDER — DIPHENHYDRAMINE HCL 50 MG/ML IJ SOLN
12.5000 mg | INTRAMUSCULAR | Status: DC | PRN
Start: 2013-11-13 — End: 2013-11-15

## 2013-11-13 MED ORDER — METHYLERGONOVINE MALEATE 0.2 MG PO TABS: 0.2000 mg | ORAL_TABLET | ORAL | Status: DC | PRN

## 2013-11-13 MED ORDER — FENTANYL CITRATE 0.05 MG/ML IJ SOLN
INTRAMUSCULAR | Status: AC
  Filled 2013-11-13: qty 2

## 2013-11-13 MED ORDER — ONDANSETRON HCL 4 MG/2ML IJ SOLN
INTRAMUSCULAR | Status: AC
  Filled 2013-11-13: qty 2

## 2013-11-13 MED ORDER — FERROUS SULFATE 325 (65 FE) MG PO TABS
325.0000 mg | ORAL_TABLET | Freq: Two times a day (BID) | ORAL | Status: DC
  Administered 2013-11-13 – 2013-11-14 (×3): 325 mg via ORAL
  Filled 2013-11-13 (×3): qty 1

## 2013-11-13 MED ORDER — DIPHENHYDRAMINE HCL 25 MG PO CAPS: 25.0000 mg | ORAL_CAPSULE | Freq: Four times a day (QID) | ORAL | Status: DC | PRN

## 2013-11-13 MED ORDER — MENTHOL 3 MG MT LOZG: 1.0000 | LOZENGE | OROMUCOSAL | Status: DC | PRN

## 2013-11-13 MED ORDER — KETOROLAC TROMETHAMINE 30 MG/ML IJ SOLN
INTRAMUSCULAR | Status: AC
  Filled 2013-11-13: qty 1

## 2013-11-13 MED ORDER — SIMETHICONE 80 MG PO CHEW
80.0000 mg | CHEWABLE_TABLET | Freq: Three times a day (TID) | ORAL | Status: DC
  Administered 2013-11-13 – 2013-11-15 (×6): 80 mg via ORAL
  Filled 2013-11-13 (×5): qty 1

## 2013-11-13 MED ORDER — METOCLOPRAMIDE HCL 5 MG/ML IJ SOLN: 10.0000 mg | Freq: Three times a day (TID) | INTRAMUSCULAR | Status: DC | PRN

## 2013-11-13 MED ORDER — MORPHINE SULFATE (PF) 0.5 MG/ML IJ SOLN
INTRAMUSCULAR | Status: DC | PRN
  Administered 2013-11-13: .15 mg via INTRATHECAL

## 2013-11-13 MED ORDER — FENTANYL CITRATE 0.05 MG/ML IJ SOLN: 25.0000 ug | INTRAMUSCULAR | Status: DC | PRN

## 2013-11-13 MED ORDER — WITCH HAZEL-GLYCERIN EX PADS: 1.0000 "application " | MEDICATED_PAD | CUTANEOUS | Status: DC | PRN

## 2013-11-13 MED ORDER — MEPERIDINE HCL 25 MG/ML IJ SOLN: 6.2500 mg | INTRAMUSCULAR | Status: DC | PRN

## 2013-11-13 MED ORDER — BUPIVACAINE IN DEXTROSE 0.75-8.25 % IT SOLN
INTRATHECAL | Status: DC | PRN
  Administered 2013-11-13: 1.6 mL via INTRATHECAL

## 2013-11-13 MED ORDER — SCOPOLAMINE 1 MG/3DAYS TD PT72
1.0000 | MEDICATED_PATCH | Freq: Once | TRANSDERMAL | Status: DC
  Administered 2013-11-13: 1.5 mg via TRANSDERMAL

## 2013-11-13 MED ORDER — ONDANSETRON HCL 4 MG/2ML IJ SOLN: 4.0000 mg | INTRAMUSCULAR | Status: DC | PRN

## 2013-11-13 MED ORDER — LACTATED RINGERS IV SOLN
INTRAVENOUS | Status: DC
  Administered 2013-11-13: 125 mL/h via INTRAVENOUS
  Administered 2013-11-13: 16:00:00 via INTRAVENOUS

## 2013-11-13 SURGICAL SUPPLY — 48 items
APL SKNCLS STERI-STRIP NONHPOA (GAUZE/BANDAGES/DRESSINGS) ×1
BARRIER ADHS 3X4 INTERCEED (GAUZE/BANDAGES/DRESSINGS) ×6 IMPLANT
BENZOIN TINCTURE PRP APPL 2/3 (GAUZE/BANDAGES/DRESSINGS) ×3 IMPLANT
BRR ADH 4X3 ABS CNTRL BYND (GAUZE/BANDAGES/DRESSINGS) ×2
CLAMP CORD UMBIL (MISCELLANEOUS) IMPLANT
CLOSURE WOUND 1/2 X4 (GAUZE/BANDAGES/DRESSINGS) ×1
CLOTH BEACON ORANGE TIMEOUT ST (SAFETY) ×3 IMPLANT
CONTAINER PREFILL 10% NBF 15ML (MISCELLANEOUS) IMPLANT
DRAPE LG THREE QUARTER DISP (DRAPES) IMPLANT
DRSG OPSITE POSTOP 4X10 (GAUZE/BANDAGES/DRESSINGS) ×3 IMPLANT
DURAPREP 26ML APPLICATOR (WOUND CARE) ×3 IMPLANT
ELECT REM PT RETURN 9FT ADLT (ELECTROSURGICAL) ×3
ELECTRODE REM PT RTRN 9FT ADLT (ELECTROSURGICAL) ×1 IMPLANT
EXTRACTOR VACUUM M CUP 4 TUBE (SUCTIONS) IMPLANT
EXTRACTOR VACUUM M CUP 4' TUBE (SUCTIONS)
GAUZE SPONGE 4X4 12PLY STRL LF (GAUZE/BANDAGES/DRESSINGS) ×6 IMPLANT
GLOVE BIOGEL M 6.5 STRL (GLOVE) ×6 IMPLANT
GLOVE BIOGEL PI IND STRL 6.5 (GLOVE) ×1 IMPLANT
GLOVE BIOGEL PI INDICATOR 6.5 (GLOVE) ×2
GOWN STRL REUS W/ TWL XL LVL3 (GOWN DISPOSABLE) ×1 IMPLANT
GOWN STRL REUS W/TWL LRG LVL3 (GOWN DISPOSABLE) ×6 IMPLANT
GOWN STRL REUS W/TWL XL LVL3 (GOWN DISPOSABLE) ×3
KIT ABG SYR 3ML LUER SLIP (SYRINGE) IMPLANT
NEEDLE HYPO 25X5/8 SAFETYGLIDE (NEEDLE) IMPLANT
NS IRRIG 1000ML POUR BTL (IV SOLUTION) ×3 IMPLANT
PACK C SECTION WH (CUSTOM PROCEDURE TRAY) ×3 IMPLANT
PAD ABD 7.5X8 STRL (GAUZE/BANDAGES/DRESSINGS) ×3 IMPLANT
PAD OB MATERNITY 4.3X12.25 (PERSONAL CARE ITEMS) ×3 IMPLANT
RTRCTR C-SECT PINK 25CM LRG (MISCELLANEOUS) ×3 IMPLANT
RTRCTR C-SECT PINK 34CM XLRG (MISCELLANEOUS) IMPLANT
STAPLER VISISTAT 35W (STAPLE) IMPLANT
STRIP CLOSURE SKIN 1/2X4 (GAUZE/BANDAGES/DRESSINGS) ×2 IMPLANT
SUT PDS AB 0 CT1 27 (SUTURE) ×6 IMPLANT
SUT PLAIN 0 NONE (SUTURE) ×3 IMPLANT
SUT VIC AB 0 CT1 27 (SUTURE) ×3
SUT VIC AB 0 CT1 27XBRD ANBCTR (SUTURE) ×1 IMPLANT
SUT VIC AB 0 CTX 36 (SUTURE) ×12
SUT VIC AB 0 CTX36XBRD ANBCTRL (SUTURE) ×4 IMPLANT
SUT VIC AB 2-0 CT1 27 (SUTURE) ×6
SUT VIC AB 2-0 CT1 TAPERPNT 27 (SUTURE) ×2 IMPLANT
SUT VIC AB 2-0 SH 27 (SUTURE) ×2
SUT VIC AB 2-0 SH 27XBRD (SUTURE) ×1 IMPLANT
SUT VIC AB 3-0 SH 27 (SUTURE)
SUT VIC AB 3-0 SH 27X BRD (SUTURE) IMPLANT
SUT VIC AB 4-0 KS 27 (SUTURE) ×3 IMPLANT
TOWEL OR 17X24 6PK STRL BLUE (TOWEL DISPOSABLE) ×3 IMPLANT
TRAY FOLEY CATH 14FR (SET/KITS/TRAYS/PACK) ×3 IMPLANT
WATER STERILE IRR 1000ML POUR (IV SOLUTION) ×3 IMPLANT

## 2013-11-13 NOTE — Anesthesia Postprocedure Evaluation (Signed)
  Anesthesia Post-op Note  Anesthesia Post Note  Patient: [name]  Procedure(s) Performed: Procedure(s) (LRB): CESAREAN SECTION WITH BILATERAL TUBAL LIGATION (Bilateral)  Anesthesia type: Spinal  Patient location: PACU  Post pain: Pain level controlled  Post assessment: Post-op Vital signs reviewed  Last Vitals:  Filed Vitals:   11/13/13 1000  BP: 111/60  Pulse: 81  Temp: 36.7 C  Resp: 20    Post vital signs: Reviewed  Level of consciousness: awake  Complications: No apparent anesthesia complications

## 2013-11-13 NOTE — Anesthesia Procedure Notes (Signed)
Spinal  Patient location during procedure: OR Start time: 11/13/2013 7:32 AM Staffing Anesthesiologist: Brayton CavesJACKSON, Jaidalyn Schillo Performed by: anesthesiologist  Preanesthetic Checklist Completed: patient identified, site marked, surgical consent, pre-op evaluation, timeout performed, IV checked, risks and benefits discussed and monitors and equipment checked Spinal Block Patient position: sitting Prep: DuraPrep Patient monitoring: heart rate, cardiac monitor, continuous pulse ox and blood pressure Approach: midline Location: L3-4 Injection technique: single-shot Needle Needle type: Sprotte  Needle gauge: 24 G Needle length: 9 cm Assessment Sensory level: T4 Additional Notes Patient identified.  Risk benefits discussed including failed block, incomplete pain control, headache, nerve damage, paralysis, blood pressure changes, nausea, vomiting, reactions to medication both toxic or allergic, and postpartum back pain.  Patient expressed understanding and wished to proceed.  All questions were answered.  Sterile technique used throughout procedure.  CSF was clear.  No parasthesia or other complications.  Please see nursing notes for vital signs.

## 2013-11-13 NOTE — H&P (Signed)
Tiffany BouchardJennifer Roberts is a 33 y.o. female presenting for  Repeat cesarean section  And tubal ligation at 39 wks and 1 day.  History OB History   Grav Para Term Preterm Abortions TAB SAB Ect Mult Living   5 2 2  2  1 1  2      Past Medical History  Diagnosis Date  . Hypothyroidism   . GERD (gastroesophageal reflux disease)     with pregnancy only  . Anemia     hx   Past Surgical History  Procedure Laterality Date  . Dilation and curettage of uterus      SAB  . Cesarean section      X 2  . Cholecystectomy    . Diagnostic laparoscopy      no surgery - ectopic preg tx with methotrxate  . Wisdom tooth extraction    . Bunionectomy      bilateral   Family History: family history is not on file. Social History:  reports that she quit smoking about 4 years ago. Her smoking use included Cigarettes. She has a 3.5 pack-year smoking history. She has never used smokeless tobacco. She reports that she drinks alcohol. She reports that she does not use illicit drugs.   Prenatal Transfer Tool  Maternal Diabetes: No Genetic Screening: Normal Maternal Ultrasounds/Referrals: Normal Fetal Ultrasounds or other Referrals:  None Maternal Substance Abuse:  No Significant Maternal Medications:  Meds include: Nexium Syntroid Significant Maternal Lab Results:  Lab values include: Other:  Other Comments:  None  Review of Systems  All other systems reviewed and are negative.      Blood pressure 128/79, pulse 76, temperature 97.9 F (36.6 C), temperature source Oral, resp. rate 20, height 5\' 6"  (1.676 m), weight 119.296 kg (263 lb), last menstrual period 02/12/2013, SpO2 100.00%. Exam Physical Exam  Vitals reviewed. Constitutional: She is oriented to person, place, and time. She appears well-developed and well-nourished.  HENT:  Head: Normocephalic.  Cardiovascular: Normal rate and regular rhythm.   Respiratory: Effort normal. No respiratory distress.  GI: There is no tenderness.   Musculoskeletal: Normal range of motion.  Neurological: She is alert and oriented to person, place, and time.  Skin: Skin is warm and dry.    Prenatal labs: ABO, Rh: --/--/O POS (02/02 0850) Antibody: NEG (02/02 0850) Rubella:   RPR: NON REACTIVE (02/02 0850)  HBsAg:    HIV: Non-reactive (07/17 0000)  GBS: Negative (01/12 0000)   Assessment/Plan: 39 wks 1 day with h/o cesarean section x 2 desires repeat and tubal ligation. R/B/A alternatives of cesarean section and tubal ligation discussed with patient including but not limited to infection / bleeding damage to bowel bladder baby with the need for further surgery.    Nghia Mcentee J. 11/13/2013, 7:15 AM

## 2013-11-13 NOTE — Anesthesia Postprocedure Evaluation (Signed)
  Anesthesia Post-op Note  Anesthesia Post Note  Patient: Tiffany BouchardJennifer Roberts  Procedure(s) Performed: Procedure(s) (LRB): CESAREAN SECTION WITH BILATERAL TUBAL LIGATION (Bilateral)  Anesthesia type: Spinal  Patient location: Mother/Baby  Post pain: Pain level controlled  Post assessment: Post-op Vital signs reviewed  Last Vitals:  Filed Vitals:   11/13/13 1330  BP: 98/62  Pulse: 87  Temp: 36.6 C  Resp: 18    Post vital signs: Reviewed  Level of consciousness: awake  Complications: No apparent anesthesia complications

## 2013-11-13 NOTE — Transfer of Care (Signed)
Immediate Anesthesia Transfer of Care Note  Patient: [name]  Procedure(s) Performed: Procedure(s): CESAREAN SECTION WITH BILATERAL TUBAL LIGATION (Bilateral)  Patient Location: PACU  Anesthesia Type:Spinal  Level of Consciousness: awake, alert  and oriented  Airway & Oxygen Therapy: Patient Spontanous Breathing  Post-op Assessment: Report given to PACU RN and Post -op Vital signs reviewed and stable  Post vital signs: Reviewed and stable  Complications: No apparent anesthesia complications

## 2013-11-13 NOTE — Lactation Note (Signed)
This note was copied from the chart of Tiffany [name]. Lactation Consultation Note  Patient Name: Tiffany Suzy BouchardJennifer Popov UJWJX'BToday's Date: 11/13/2013 Reason for consult: Initial assessment;Other (Comment) (charting for exclusion)  This is an experienced multipara; breastfed first child for 6 months, second child for 8 months; new baby latching well and exclusively breastfeeding but mom will be returning to work.  LATCH scores = 7/9 per RN assessment with feedings of 15-30 minutes each and output above minimum at this hour of life.  LC encouraged STS and cue feedings and mom states she knows how to hand express.  LC encouraged review of Baby and Me pp 9, 14 and 20-25 for STS and BF information. LC provided [employer] Resource brochure and reviewed [facility] services and list of community and web site resources.    Maternal Data Formula Feeding for Exclusion: Yes Reason for exclusion: Mother's choice to formula and breast feed on admission Infant to breast within first hour of birth: No (C/S; baby to breast at slightly over one hour of age but nursed well) Breastfeeding delayed due to:: Other (comment) Has patient been taught Hand Expression?: Yes (mom states she knows how to hand express colostrum/milk) Does the patient have breastfeeding experience prior to this delivery?: Yes  Feeding    LATCH Score/Interventions            Most recent LATCH score=9, per RN assessment          Lactation Tools Discussed/Used   STS, cue feedings, hand expression  Consult Status Consult Status: Follow-up Date: 11/14/13 Follow-up type: In-patient    Warrick ParisianBryant, Secret Kristensen [facility] 11/13/2013, 9:19 PM

## 2013-11-13 NOTE — Op Note (Signed)
Cesarean Section Procedure Note  Indications: previous uterine incision low transverse x 2  Pre-operative Diagnosis: 39 week 1 day pregnancy.  Post-operative Diagnosis: same  Surgeon: Jessee AversOLE,Price Lachapelle J.   Assistants: Dr. Geryl RankinsEvelyn Varnado   Anesthesia: Spinal anesthesia  ASA Class: 2   Procedure Details   The patient was seen in the Holding Room. The risks, benefits, complications, treatment options, and expected outcomes were discussed with the patient.  The patient concurred with the proposed plan, giving informed consent.  The site of surgery properly noted/marked. The patient was taken to Operating Room # 9, identified as Tiffany BouchardJennifer Roberts and the procedure verified as C-Section Delivery. A Time Out was held and the above information confirmed.  After induction of anesthesia, the patient was draped and prepped in the usual sterile manner. A Pfannenstiel incision was made and carried down through the subcutaneous tissue to the fascia. Fascial incision was made and extended transversely. The fascia was separated from the underlying rectus tissue superiorly and inferiorly. The peritoneum was identified and entered. Peritoneal incision was extended longitudinally. The utero-vesical peritoneal reflection was incised transversely and the bladder flap was bluntly freed from the lower uterine segment. A low transverse uterine incision was made. Delivered from cephalic presentation was a Female with Apgar scores of 9 at one minute and 9 at five minutes. After the umbilical cord was clamped and cut cord blood was obtained for evaluation. The placenta was removed intact and appeared normal. The uterine outline, tubes and ovaries appeared normal. The uterine incision was closed with running locked sutures of 0 vicryl . Hemostasis was observed. The right fallopian tube was identified and followed out to the fimbriated end. The mid portion of the tube was grasped with babcock clamped . The mesosalpinx was incised  with the bovie. A free tie of 2-0  plain gut   Was used to ligate the proximal and distal end. The intervening section was excised and sent  To pathology. This was repeated on the Left fallopian tube . Lavage was carried out until clear. The fascia was then reapproximated with running sutures of 0 pds. The skin was reapproximated with 4-0 vicryl .  Instrument, sponge, and needle counts were correct prior the abdominal closure and at the conclusion of the case.   Findings:  Adhesions of the omentum to the anterior abdominal wall and adhesions of the bladder to the lower uterine segment.  Normal fallopian tubes and ovaries bilaterally.  Female infant in the cephalic presentation   Estimated Blood Loss:  700 mL         Drains: Foley catheter          Total IV Fluids:  Per anesthesia ml         Specimens: Fallopian Tube, Right, Left and Disposition:  Sent to Pathology          Placenta sent to labor and delivery  Implants: none         Complications:  None; patient tolerated the procedure well.         Disposition: PACU - hemodynamically stable.         Condition: stable  Attending Attestation: I performed the procedure.

## 2013-11-14 ENCOUNTER — Encounter (HOSPITAL_COMMUNITY): Payer: Self-pay | Admitting: Obstetrics and Gynecology

## 2013-11-14 LAB — CBC
HCT: 30.4 % — ABNORMAL LOW (ref 36.0–46.0)
HEMOGLOBIN: 10.1 g/dL — AB (ref 12.0–15.0)
MCH: 31.5 pg (ref 26.0–34.0)
MCHC: 33.2 g/dL (ref 30.0–36.0)
MCV: 94.7 fL (ref 78.0–100.0)
PLATELETS: 222 10*3/uL (ref 150–400)
RBC: 3.21 MIL/uL — AB (ref 3.87–5.11)
RDW: 15.7 % — ABNORMAL HIGH (ref 11.5–15.5)
WBC: 10.1 10*3/uL (ref 4.0–10.5)

## 2013-11-14 LAB — BIRTH TISSUE RECOVERY COLLECTION (PLACENTA DONATION)

## 2013-11-14 NOTE — Progress Notes (Signed)
Subjective: Postpartum Day 1 : Cesarean Delivery Patient reports tolerating PO, + flatus and no problems voiding.    Objective: Vital signs in last 24 hours: Temp:  [97.2 F (36.2 C)-98.7 F (37.1 C)] 98 F (36.7 C) (02/04 0907) Pulse Rate:  [62-79] 62 (02/04 0907) Resp:  [18-20] 20 (02/04 0907) BP: (93-119)/(51-71) 93/51 mmHg (02/04 0907) SpO2:  [96 %-98 %] 97 % (02/04 0907)  Physical Exam:  General: alert and cooperative Lochia: appropriate Uterine Fundus: firm Incision: healing well DVT Evaluation: No evidence of DVT seen on physical exam.   Recent Labs  11/12/13 0850 11/14/13 0605  HGB 11.6* 10.1*  HCT 35.0* 30.4*    Assessment/Plan: Status post Cesarean section. Doing well postoperatively.  Continue current care.  [name] J. 11/14/2013, 2:16 PM

## 2013-11-15 LAB — TYPE AND SCREEN
ABO/RH(D): O POS
ANTIBODY SCREEN: NEGATIVE
UNIT DIVISION: 0
Unit division: 0

## 2013-11-15 MED ORDER — OXYCODONE-ACETAMINOPHEN 5-325 MG PO TABS: 1.0000 | ORAL_TABLET | ORAL | Status: DC | PRN

## 2013-11-15 MED ORDER — IBUPROFEN 600 MG PO TABS: 600.0000 mg | ORAL_TABLET | Freq: Four times a day (QID) | ORAL | Status: DC | PRN

## 2013-11-15 NOTE — Discharge Summary (Signed)
Obstetric Discharge Summary Reason for Admission: cesarean section Prenatal Procedures: none Intrapartum Procedures: cesarean: low cervical, transverse and tubal ligation Postpartum Procedures: none Complications-Operative and Postpartum: none Hemoglobin  Date Value Range Status  11/14/2013 10.1* 12.0 - 15.0 g/dL Final     HCT  Date Value Range Status  11/14/2013 30.4* 36.0 - 46.0 % Final    Physical Exam:  General: alert and cooperative Lochia: appropriate Uterine Fundus: firm Incision: healing well DVT Evaluation: No evidence of DVT seen on physical exam.  Discharge Diagnoses: Term Pregnancy-delivered  Discharge Information: Date: 11/15/2013 Activity: pelvic rest Diet: routine Medications: PNV, Ibuprofen, Percocet and synthroid Condition: stable Instructions: refer to practice specific booklet Discharge to: home Follow-up Information   Follow up with Jessee AversOLE,Savreen Gebhardt J., MD. Schedule an appointment as soon as possible for a visit in 2 weeks. (pt may already have an appointment )    Specialty:  Obstetrics and Gynecology   Contact information:   [address] [place] [address] [phone]       Newborn Data: Live born female  Birth Weight: 7 lb 14.5 oz (3586 g) APGAR: 9, 9  Home with mother.  Tamekia Rotter J. 11/15/2013, 7:17 AM

## 2014-03-21 ENCOUNTER — Other Ambulatory Visit: Payer: Self-pay | Admitting: Obstetrics and Gynecology

## 2014-03-21 ENCOUNTER — Other Ambulatory Visit (HOSPITAL_COMMUNITY)
Admission: RE | Admit: 2014-03-21 | Discharge: 2014-03-21 | Disposition: A | Discharge status: Home / Self Care | Payer: BC Managed Care – PPO | Source: Ambulatory Visit | Attending: Obstetrics and Gynecology | Admitting: Obstetrics and Gynecology

## 2014-03-21 DIAGNOSIS — Z01419 Encounter for gynecological examination (general) (routine) without abnormal findings: Secondary | ICD-10-CM | POA: Insufficient documentation

## 2014-03-26 LAB — CYTOLOGY - PAP

## 2014-04-09 ENCOUNTER — Other Ambulatory Visit: Payer: Self-pay | Admitting: Internal Medicine

## 2014-04-09 DIAGNOSIS — E042 Nontoxic multinodular goiter: Secondary | ICD-10-CM

## 2014-04-15 ENCOUNTER — Ambulatory Visit
Admission: RE | Admit: 2014-04-15 | Discharge: 2014-04-15 | Disposition: A | Discharge status: Home / Self Care | Payer: BC Managed Care – PPO | Source: Ambulatory Visit | Attending: Internal Medicine | Admitting: Internal Medicine

## 2014-04-15 DIAGNOSIS — E042 Nontoxic multinodular goiter: Secondary | ICD-10-CM

## 2014-08-12 ENCOUNTER — Encounter (HOSPITAL_COMMUNITY): Payer: Self-pay | Admitting: Obstetrics and Gynecology

## 2014-10-09 ENCOUNTER — Ambulatory Visit
Admission: RE | Admit: 2014-10-09 | Discharge: 2014-10-09 | Disposition: A | Discharge status: Home / Self Care | Payer: BC Managed Care – PPO | Source: Ambulatory Visit | Attending: Family | Admitting: Family

## 2014-10-09 ENCOUNTER — Other Ambulatory Visit: Payer: Self-pay | Admitting: Family

## 2014-10-09 DIAGNOSIS — R0989 Other specified symptoms and signs involving the circulatory and respiratory systems: Secondary | ICD-10-CM

## 2014-10-10 ENCOUNTER — Ambulatory Visit
Admission: RE | Admit: 2014-10-10 | Discharge: 2014-10-10 | Disposition: A | Discharge status: Home / Self Care | Payer: BC Managed Care – PPO | Source: Ambulatory Visit | Attending: Family | Admitting: Family

## 2014-10-10 ENCOUNTER — Other Ambulatory Visit: Payer: Self-pay | Admitting: Family

## 2014-10-10 DIAGNOSIS — R9389 Abnormal findings on diagnostic imaging of other specified body structures: Secondary | ICD-10-CM

## 2014-10-15 ENCOUNTER — Other Ambulatory Visit: Payer: Self-pay | Admitting: Family

## 2014-10-15 ENCOUNTER — Ambulatory Visit
Admission: RE | Admit: 2014-10-15 | Discharge: 2014-10-15 | Disposition: A | Discharge status: Home / Self Care | Payer: BLUE CROSS/BLUE SHIELD | Source: Ambulatory Visit | Attending: Family | Admitting: Family

## 2014-10-15 DIAGNOSIS — R9389 Abnormal findings on diagnostic imaging of other specified body structures: Secondary | ICD-10-CM

## 2016-02-13 IMAGING — CR DG CHEST 2V
2 series · 2 of 2 positions shown · non-contrast
Comparison: None.

CLINICAL DATA: Shortness of breath and chest pain.

EXAM:
CHEST  2 VIEW

[w chest pa]
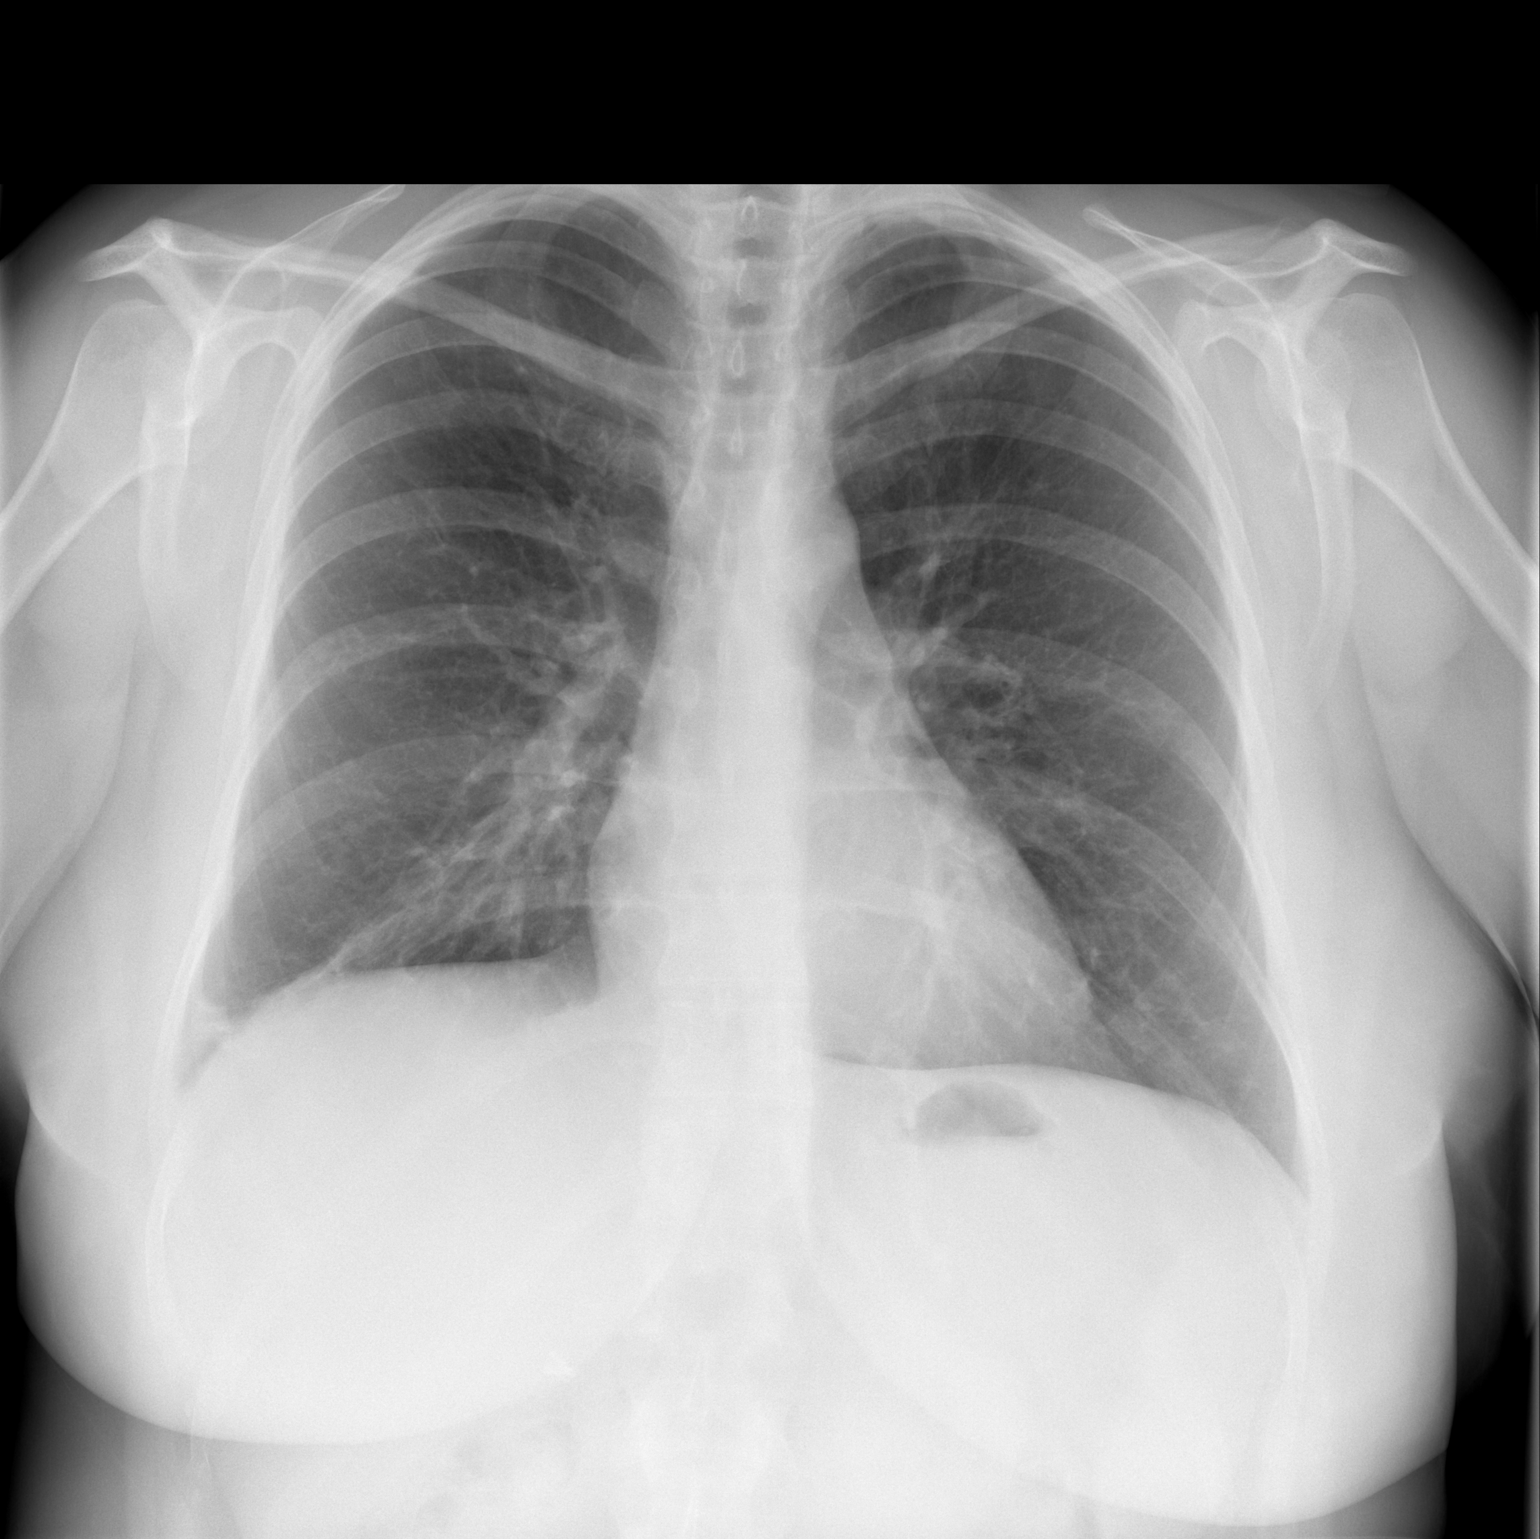

[w chest lat]
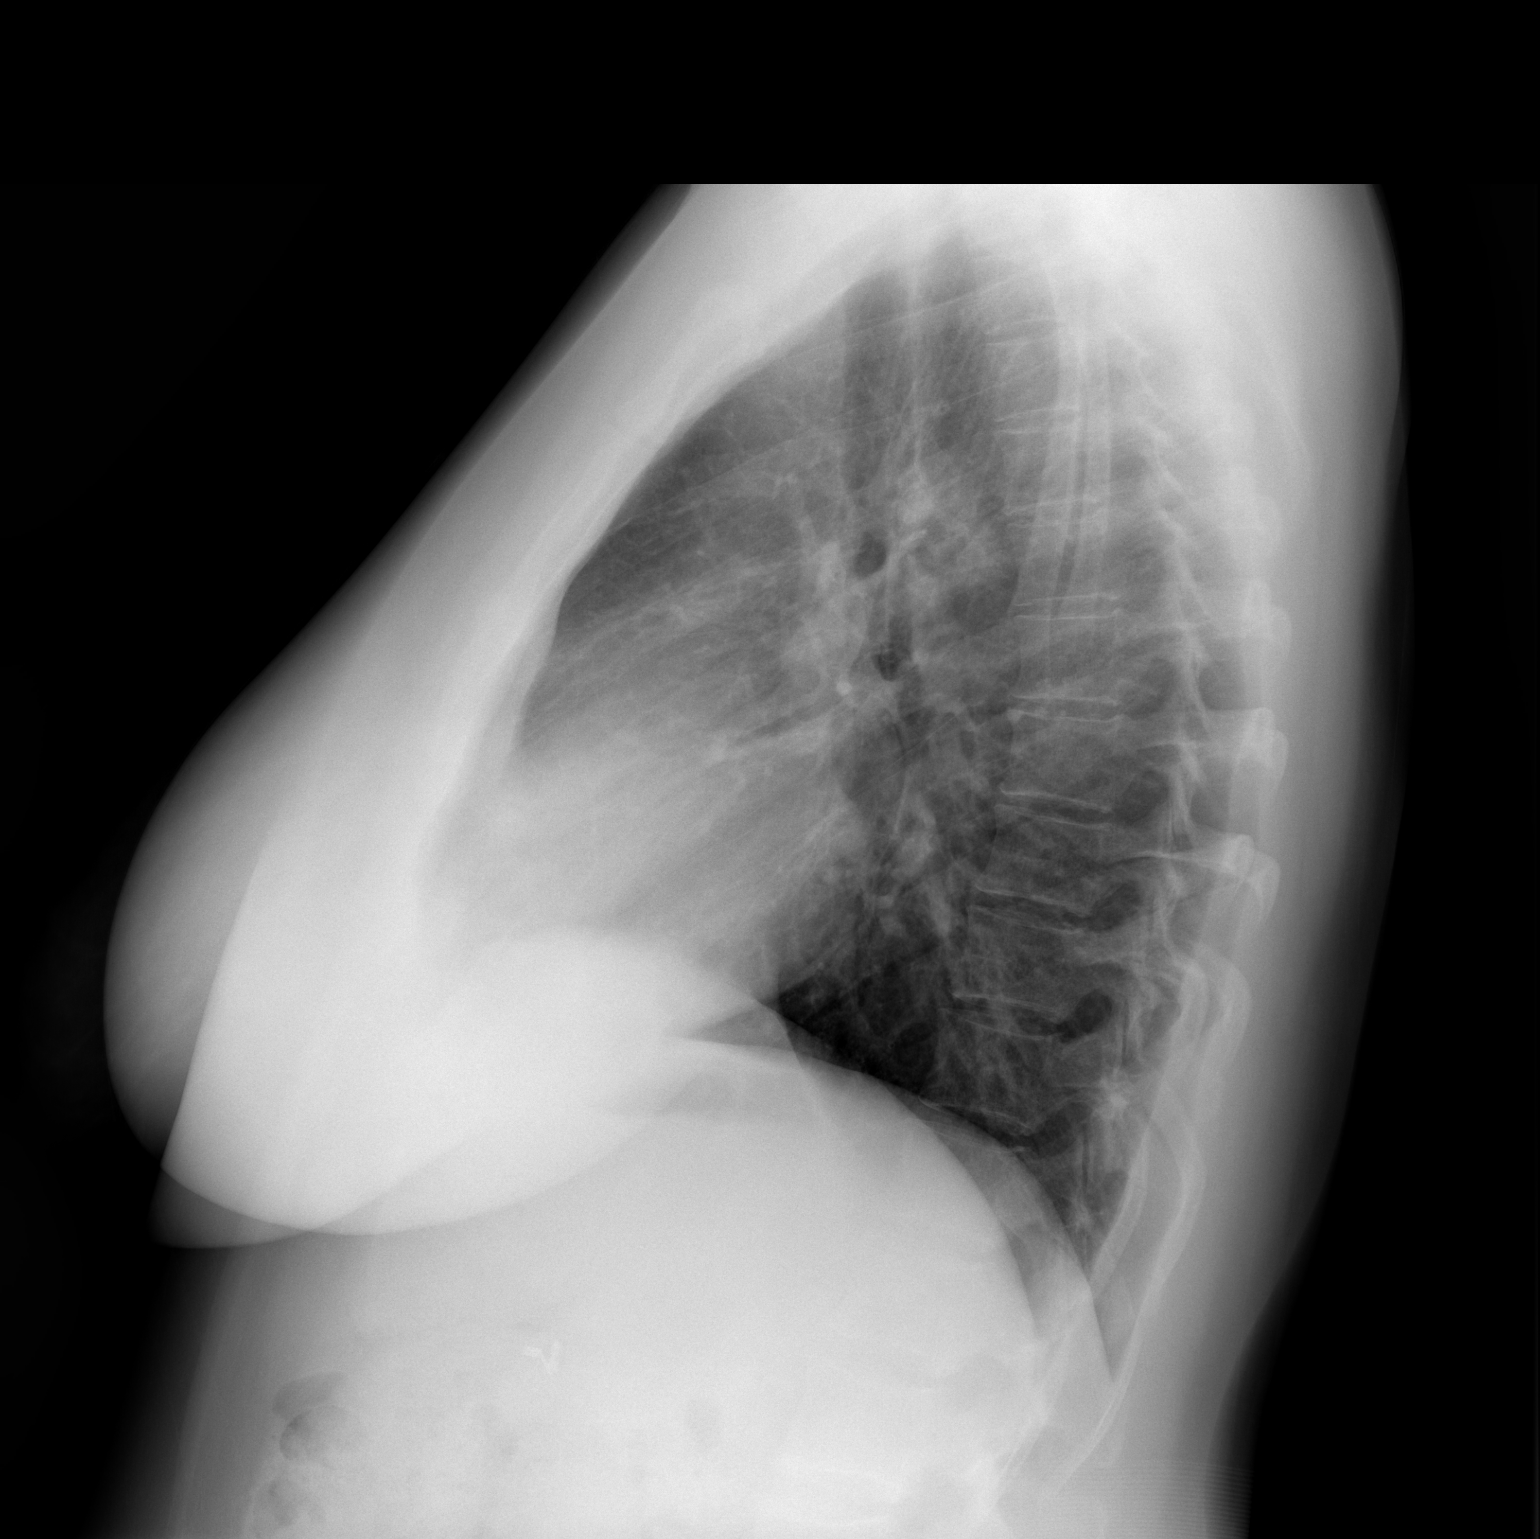

[2 of 2 positions shown; findings below may reference images not displayed]

FINDINGS: Nodular opacity projecting over the right costophrenic angle. There
is no focal parenchymal opacity, pleural effusion, or pneumothorax.
The heart and mediastinal contours are unremarkable.

The osseous structures are unremarkable.
IMPRESSION: No active cardiopulmonary disease.

There is a nodular opacity projecting over the right costophrenic
angle which may reflect a nipple shadow versus a true pulmonary
nodule. Recommend repeat chest radiograph with nipple marker.

## 2017-01-31 ENCOUNTER — Other Ambulatory Visit: Payer: Self-pay | Admitting: Internal Medicine

## 2017-01-31 DIAGNOSIS — R911 Solitary pulmonary nodule: Secondary | ICD-10-CM

## 2017-02-17 ENCOUNTER — Ambulatory Visit
Admission: RE | Admit: 2017-02-17 | Discharge: 2017-02-17 | Disposition: A | Discharge status: Home / Self Care | Payer: No Typology Code available for payment source | Source: Ambulatory Visit | Attending: Internal Medicine | Admitting: Internal Medicine

## 2017-02-17 DIAGNOSIS — R911 Solitary pulmonary nodule: Secondary | ICD-10-CM

## 2017-05-26 ENCOUNTER — Other Ambulatory Visit (HOSPITAL_COMMUNITY)
Admission: RE | Admit: 2017-05-26 | Discharge: 2017-05-26 | Disposition: A | Discharge status: Home / Self Care | Payer: No Typology Code available for payment source | Source: Ambulatory Visit | Attending: Internal Medicine | Admitting: Internal Medicine

## 2017-05-26 ENCOUNTER — Other Ambulatory Visit: Payer: Self-pay | Admitting: Internal Medicine

## 2017-05-26 DIAGNOSIS — Z124 Encounter for screening for malignant neoplasm of cervix: Secondary | ICD-10-CM | POA: Diagnosis not present

## 2017-06-01 LAB — CYTOLOGY - PAP
DIAGNOSIS: NEGATIVE
HPV: NOT DETECTED

## 2019-11-21 ENCOUNTER — Ambulatory Visit (INDEPENDENT_AMBULATORY_CARE_PROVIDER_SITE_OTHER): Payer: No Typology Code available for payment source | Admitting: Family Medicine

## 2019-11-21 ENCOUNTER — Other Ambulatory Visit: Payer: Self-pay

## 2019-11-21 ENCOUNTER — Encounter (INDEPENDENT_AMBULATORY_CARE_PROVIDER_SITE_OTHER): Payer: Self-pay | Admitting: Family Medicine

## 2019-11-21 VITALS — BP 124/78 | HR 71 | Temp 97.8°F | Ht 67.0 in | Wt 267.0 lb

## 2019-11-21 DIAGNOSIS — R5383 Other fatigue: Secondary | ICD-10-CM | POA: Diagnosis not present

## 2019-11-21 DIAGNOSIS — E063 Autoimmune thyroiditis: Secondary | ICD-10-CM | POA: Diagnosis not present

## 2019-11-21 DIAGNOSIS — Z9189 Other specified personal risk factors, not elsewhere classified: Secondary | ICD-10-CM | POA: Diagnosis not present

## 2019-11-21 DIAGNOSIS — E538 Deficiency of other specified B group vitamins: Secondary | ICD-10-CM | POA: Diagnosis not present

## 2019-11-21 DIAGNOSIS — Z6841 Body Mass Index (BMI) 40.0 and over, adult: Secondary | ICD-10-CM

## 2019-11-21 DIAGNOSIS — R0602 Shortness of breath: Secondary | ICD-10-CM

## 2019-11-21 DIAGNOSIS — Z1331 Encounter for screening for depression: Secondary | ICD-10-CM

## 2019-11-21 DIAGNOSIS — Z0289 Encounter for other administrative examinations: Secondary | ICD-10-CM

## 2019-11-21 NOTE — Progress Notes (Signed)
Chief Complaint:   OBESITY [name] (MR# [id number]) is a 39 y.o. female who presents for evaluation and treatment of obesity and related comorbidities. Current BMI is Body mass index is 41.82 kg/m.Marland Kitchen [name] has been struggling with her weight for many years and has been unsuccessful in either losing weight, maintaining weight loss, or reaching her healthy weight goal.  [name] is currently in the action stage of change and ready to dedicate time achieving and maintaining a healthier weight. [name] is interested in becoming our patient and working on intensive lifestyle modifications including (but not limited to) diet and exercise for weight loss.  [name]'s habits were reviewed today and are as follows: Her family eats meals together, she thinks her family will eat healthier with her, her desired weight loss is 107 pounds, she started gaining weight after her second pregnancy, her heaviest weight ever was 280 pounds, she has significant food cravings issues, she snacks frequently in the evenings, she skips breakfast some days, she is frequently drinking liquids with calories, she frequently makes poor food choices, she frequently eats larger portions than normal, she has binge eating behaviors and she struggles with emotional eating.  Breakfast consists of Vanilla Greek yogurt, approximately 1/3 cup with granola (feels satisfied). Lunch consists of a lean cuisine with streamed carrots, or [name]'s chicken bowl, or [employer] chicken teriyaki bowl (feels full). Approximately 3:00 PM she snacks on Cheez-it's or pretzels in a ziplock bag (hunger and convenience). Dinner consists of frozen Stouffer's lasagna or chicken cooked in the air fryer, vegetable (1/2 cup broccoli) and a starch; approximately 1 cup. Before bed [name] has Halo Top ice cream or Reese's cup.  Depression Screen [name]'s Food and Mood (modified PHQ-9) score was mildly positive.  Depression screen [facility] 2/9 11/21/2019    Decreased Interest 3  Down, Depressed, Hopeless 1  PHQ - 2 Score 4  Altered sleeping 0  Tired, decreased energy 3  Change in appetite 2  Feeling bad or failure about yourself  0  Trouble concentrating 0  Moving slowly or fidgety/restless 0  Suicidal thoughts 0  PHQ-9 Score 9  Difficult doing work/chores Not difficult at all   Subjective:   Other fatigue (new) [name] admits to daytime somnolence and admits to waking up still tired. Patent has a history of symptoms of daytime fatigue and morning fatigue. [name] generally gets 7 hours of sleep per night, and states that she has generally restful sleep. Snoring is present. Apneic episodes are not present. Epworth Sleepiness Score is 5. EKG ordered today shows normal sinus rhythm at 72 BPM.  Shortness of breath on exertion (new) [name] notes increasing shortness of breath with exercising and seems to be worsening over time with weight gain. She notes getting out of breath sooner with activity than she used to.This has not gotten worse recently. [name] denies shortness of breath at rest or orthopnea.  B12 nutritional deficiency  [name] is not on B12 supplementation. Her last lab draw showed B12 low level of normal.  She is not a vegetarian. She does not have a history of weight loss surgery.   Hashimoto's thyroiditis [name] sees Dr. Sharl Ma two times a week. Her thyroid is well controlled. She is on synthroid 125 mcg daily.  At risk for deficient intake of food The patient is at a higher than average risk of deficient intake of food due to recent food recall.  Assessment/Plan:   Other fatigue (new) [name] does feel that her weight is causing her energy  to be lower than it should be. Fatigue may be related to obesity, depression or many other causes. Labs and EKG will be ordered, and in the meanwhile, [name] will focus on self care including making healthy food choices, increasing physical activity and focusing on stress  reduction.  Shortness of breath on exertion (new) [name] does feel that she gets out of breath more easily that she used to when she exercises. [name]'s shortness of breath appears to be obesity related and exercise induced. She has agreed to work on weight loss and gradually increase exercise to treat her exercise induced shortness of breath. Labs and indirect calorimetry will be ordered today and we will continue to monitor closely.  B12 nutritional deficiency  The diagnosis was reviewed with the patient. Counseling provided today, see below. We will check B12 level today and we will continue to monitor. Orders and follow up as documented in patient record.  Counseling . The body needs vitamin B12: to make red blood cells; to make DNA; and to help the nerves work properly so they can carry messages from the brain to the body.  . The main causes of vitamin B12 deficiency include dietary deficiency, digestive diseases, pernicious anemia, and having a surgery in which part of the stomach or small intestine is removed.  . Certain medicines can make it harder for the body to absorb vitamin B12. These medicines include: heartburn medications; some antibiotics; some medications used to treat diabetes, gout, and high cholesterol.  . In some cases, there are no symptoms of this condition. If the condition leads to anemia or nerve damage, various symptoms can occur, such as weakness or fatigue, shortness of breath, and numbness or tingling in your hands and feet.   . Treatment:  o May include taking vitamin B12 supplements.  o Avoid alcohol.  o Eat lots of healthy foods that contain vitamin B12: - Beef, pork, chicken, [place], and organ meats, such as liver.  - Seafood: This includes clams, rainbow trout, salmon, tuna, and haddock. Eggs.  - Cereal and dairy products that are fortified: This means that vitamin B12 has been added to the food.   Hashimoto's thyroiditis  We will check thyroid panel  today.  At risk for deficient intake of food [name] was given approximately 15 minutes of deficit intake of food prevention counseling today. [name] is at risk for eating too few calories based on current food recall. She was encouraged to focus on meeting caloric and protein goals according to her recommended meal plan.   Class 3 severe obesity with serious comorbidity and body mass index (BMI) of 40.0 to 44.9 in adult, unspecified obesity type (HCC) [name] is currently in the action stage of change and her goal is to continue with weight loss efforts. I recommend [name] begin the structured treatment plan as follows:  She has agreed to the Category 3 Plan.  Exercise goals: No exercise has been prescribed at this time.   Behavioral modification strategies: increasing lean protein intake, increasing vegetables, meal planning and cooking strategies and better snacking choices.  She was informed of the importance of frequent follow-up visits to maximize her success with intensive lifestyle modifications for her multiple health conditions. She was informed we would discuss her lab results at her next visit unless there is a critical issue that needs to be addressed sooner. [name] agreed to keep her next visit at the agreed upon time to discuss these results.  Objective:   Blood pressure 124/78, pulse 71,  temperature 97.8 F (36.6 C), temperature source Oral, height 5\' 7"  (1.702 m), weight 267 lb (121.1 kg), last menstrual period 11/01/2019, SpO2 95 %, unknown if currently breastfeeding. Body mass index is 41.82 kg/m.  EKG: Normal sinus rhythm, rate 72 BPM.  Indirect Calorimeter completed today shows a VO2 of 340 and a REE of 2370.  Her calculated basal metabolic rate is 1443 thus her basal metabolic rate is better than expected.  General: Cooperative, alert, well developed, in no acute distress. HEENT: Conjunctivae and lids unremarkable. Cardiovascular: Regular rhythm.  Lungs:  Normal work of breathing. Neurologic: No focal deficits.   No results found for: CREATININE, BUN, NA, K, CL, CO2 No results found for: ALT, AST, GGT, ALKPHOS, BILITOT No results found for: HGBA1C No results found for: INSULIN No results found for: TSH No results found for: CHOL, HDL, LDLCALC, LDLDIRECT, TRIG, CHOLHDL Lab Results  Component Value Date   WBC 10.1 11/14/2013   HGB 10.1 (L) 11/14/2013   HCT 30.4 (L) 11/14/2013   MCV 94.7 11/14/2013   PLT 222 11/14/2013   No results found for: IRON, TIBC, FERRITIN   Attestation Statements:   Reviewed by clinician on day of visit: allergies, medications, problem list, medical history, surgical history, family history, social history, and previous encounter notes.  I, Doreene Nest, am acting as transcriptionist for Eber Jones, MD.  I have reviewed the above documentation for accuracy and completeness, and I agree with the above. - Ilene Qua, MD

## 2019-11-22 LAB — LIPID PANEL WITH LDL/HDL RATIO
Cholesterol, Total: 119 mg/dL (ref 100–199)
HDL: 45 mg/dL (ref >39)
LDL Chol Calc (NIH): 55 mg/dL (ref 0–99)
LDL/HDL Ratio: 1.2 ratio (ref 0.0–3.2)
Triglycerides: 104 mg/dL (ref 0–149)
VLDL Cholesterol Cal: 19 mg/dL (ref 5–40)

## 2019-11-22 LAB — CBC WITH DIFFERENTIAL/PLATELET
Basophils Absolute: 0.1 10*3/uL (ref 0.0–0.2)
Basos: 1 %
EOS (ABSOLUTE): 0.2 10*3/uL (ref 0.0–0.4)
Eos: 2 %
Hematocrit: 41.9 % (ref 34.0–46.6)
Hemoglobin: 13.1 g/dL (ref 11.1–15.9)
Immature Grans (Abs): 0 10*3/uL (ref 0.0–0.1)
Immature Granulocytes: 0 %
Lymphocytes Absolute: 1.4 10*3/uL (ref 0.7–3.1)
Lymphs: 16 %
MCH: 27.6 pg (ref 26.6–33.0)
MCHC: 31.3 g/dL — ABNORMAL LOW (ref 31.5–35.7)
MCV: 88 fL (ref 79–97)
Monocytes Absolute: 0.5 10*3/uL (ref 0.1–0.9)
Monocytes: 5 %
Neutrophils Absolute: 6.9 10*3/uL (ref 1.4–7.0)
Neutrophils: 76 %
Platelets: 256 10*3/uL (ref 150–450)
RBC: 4.74 x10E6/uL (ref 3.77–5.28)
RDW: 14.4 % (ref 11.7–15.4)
WBC: 9 10*3/uL (ref 3.4–10.8)

## 2019-11-22 LAB — COMPREHENSIVE METABOLIC PANEL
ALT: 21 IU/L (ref 0–32)
AST: 16 IU/L (ref 0–40)
Albumin/Globulin Ratio: 1.8 (ref 1.2–2.2)
Albumin: 4.3 g/dL (ref 3.8–4.8)
Alkaline Phosphatase: 104 IU/L (ref 39–117)
BUN/Creatinine Ratio: 16 (ref 9–23)
BUN: 14 mg/dL (ref 6–20)
Bilirubin Total: 0.3 mg/dL (ref 0.0–1.2)
CO2: 19 mmol/L — ABNORMAL LOW (ref 20–29)
Calcium: 9.2 mg/dL (ref 8.7–10.2)
Chloride: 107 mmol/L — ABNORMAL HIGH (ref 96–106)
Creatinine, Ser: 0.9 mg/dL (ref 0.57–1.00)
GFR calc Af Amer: 93 mL/min/{1.73_m2} (ref >59)
GFR calc non Af Amer: 81 mL/min/{1.73_m2} (ref >59)
Globulin, Total: 2.4 g/dL (ref 1.5–4.5)
Glucose: 83 mg/dL (ref 65–99)
Potassium: 4.6 mmol/L (ref 3.5–5.2)
Sodium: 141 mmol/L (ref 134–144)
Total Protein: 6.7 g/dL (ref 6.0–8.5)

## 2019-11-22 LAB — VITAMIN D 25 HYDROXY (VIT D DEFICIENCY, FRACTURES): Vit D, 25-Hydroxy: 57 ng/mL (ref 30.0–100.0)

## 2019-11-22 LAB — T3: T3, Total: 114 ng/dL (ref 71–180)

## 2019-11-22 LAB — FOLATE: Folate: 11.5 ng/mL (ref >3.0)

## 2019-11-22 LAB — T4, FREE: Free T4: 1.24 ng/dL (ref 0.82–1.77)

## 2019-11-22 LAB — HEMOGLOBIN A1C
Est. average glucose Bld gHb Est-mCnc: 111 mg/dL
Hgb A1c MFr Bld: 5.5 % (ref 4.8–5.6)

## 2019-11-22 LAB — TSH: TSH: 0.626 u[IU]/mL (ref 0.450–4.500)

## 2019-11-22 LAB — INSULIN, RANDOM: INSULIN: 16.1 u[IU]/mL (ref 2.6–24.9)

## 2019-11-22 LAB — VITAMIN B12: Vitamin B-12: 427 pg/mL (ref 232–1245)

## 2019-12-05 ENCOUNTER — Other Ambulatory Visit: Payer: Self-pay

## 2019-12-05 ENCOUNTER — Encounter (INDEPENDENT_AMBULATORY_CARE_PROVIDER_SITE_OTHER): Payer: Self-pay | Admitting: Family Medicine

## 2019-12-05 ENCOUNTER — Ambulatory Visit (INDEPENDENT_AMBULATORY_CARE_PROVIDER_SITE_OTHER): Payer: No Typology Code available for payment source | Admitting: Family Medicine

## 2019-12-05 VITALS — BP 119/77 | HR 71 | Temp 97.9°F | Ht 67.0 in | Wt 260.0 lb

## 2019-12-05 DIAGNOSIS — E559 Vitamin D deficiency, unspecified: Secondary | ICD-10-CM | POA: Diagnosis not present

## 2019-12-05 DIAGNOSIS — E8881 Metabolic syndrome: Secondary | ICD-10-CM | POA: Diagnosis not present

## 2019-12-05 DIAGNOSIS — E063 Autoimmune thyroiditis: Secondary | ICD-10-CM | POA: Diagnosis not present

## 2019-12-05 DIAGNOSIS — Z6841 Body Mass Index (BMI) 40.0 and over, adult: Secondary | ICD-10-CM

## 2019-12-05 NOTE — Progress Notes (Signed)
Chief Complaint:   OBESITY [name] is here to discuss her progress with her obesity treatment plan along with follow-up of her obesity related diagnoses. [name] is on the Category 3 Plan and states she is following her eating plan approximately 80% of the time. [name] states she is exercising 0 minutes 0 times per week.  Today's visit was #: 2 Starting weight: 267 lbs Starting date: 11/21/2019 Today's weight: 260 lbs Today's date: 12/05/2019 Total lbs lost to date: 7 Total lbs lost since last in-office visit: 7  Interim History: [name]'s first week was difficult, especially with the portion of meat and vegetables at dinner. Her power went out for two days, so she had to do grab and go. She has broken up the dinner meal and she is able to get all of the food in. She is doing pretzels and popsicle after dinner. She is looking for options when going out to eat.  Subjective:   Insulin resistance [name] has a diagnosis of insulin resistance and her last Hgb A1c was 5.5 and insulin level was 16.1 (11/21/19). She is on metformin two times daily. She admits to carb cravings after dinner. She continues to work on diet and exercise to decrease her risk of diabetes.  Lab Results  Component Value Date   INSULIN 16.1 11/21/2019   Lab Results  Component Value Date   HGBA1C 5.5 11/21/2019   Vitamin D deficiency [name]'s Vitamin D level was 57.0 on 11/21/19. She is on vitamin D 5,000 IU daily and 50,000 IU every two weeks. She denies nausea, vomiting or muscle weakness.  Hashimoto's thyroiditis [name] sees Dr. Sharl Ma for management. Her thyroid panel was within normal limits. She is on levothyroxine 125 mcg PO qAM.  Assessment/Plan:   Insulin resistance [name] will continue to work on weight loss, exercise, and decreasing simple carbohydrates to help decrease the risk of diabetes. We will repeat labs in 3 months. [name] agreed to follow-up with [place] as directed to  closely monitor her progress.  Vitamin D deficiency Low Vitamin D level contributes to fatigue and are associated with obesity, breast, and colon cancer. [name] will continue vitamin D supplementation (no refill needed), and she will follow-up for routine testing of Vitamin D, at least 2-3 times per year to avoid over-replacement.  Hashimoto's thyroiditis [name] will follow up with Dr. Sharl Ma.  Class 3 severe obesity with serious comorbidity and body mass index (BMI) of 40.0 to 44.9 in adult, unspecified obesity type [facility]) [name] is currently in the action stage of change. As such, her goal is to continue with weight loss efforts. She has agreed to the Category 3 Plan.   Behavioral modification strategies: increasing lean protein intake, increasing vegetables, meal planning and cooking strategies, keeping healthy foods in the home and planning for success.  [name] has agreed to follow-up with our clinic in 2 weeks. She was informed of the importance of frequent follow-up visits to maximize her success with intensive lifestyle modifications for her multiple health conditions.   Objective:   Blood pressure 119/77, pulse 71, temperature 97.9 F (36.6 C), temperature source Oral, height 5\' 7"  (1.702 m), weight 260 lb (117.9 kg), last menstrual period 11/27/2019, SpO2 97 %, unknown if currently breastfeeding. Body mass index is 40.72 kg/m.  General: Cooperative, alert, well developed, in no acute distress. HEENT: Conjunctivae and lids unremarkable. Cardiovascular: Regular rhythm.  Lungs: Normal work of breathing. Neurologic: No focal deficits.   Lab Results  Component Value Date  CREATININE 0.90 11/21/2019   BUN 14 11/21/2019   NA 141 11/21/2019   K 4.6 11/21/2019   CL 107 (H) 11/21/2019   CO2 19 (L) 11/21/2019   Lab Results  Component Value Date   ALT 21 11/21/2019   AST 16 11/21/2019   ALKPHOS 104 11/21/2019   BILITOT 0.3 11/21/2019   Lab Results  Component Value Date    HGBA1C 5.5 11/21/2019   Lab Results  Component Value Date   INSULIN 16.1 11/21/2019   Lab Results  Component Value Date   TSH 0.626 11/21/2019   Lab Results  Component Value Date   CHOL 119 11/21/2019   HDL 45 11/21/2019   LDLCALC 55 11/21/2019   TRIG 104 11/21/2019   Lab Results  Component Value Date   WBC 9.0 11/21/2019   HGB 13.1 11/21/2019   HCT 41.9 11/21/2019   MCV 88 11/21/2019   PLT 256 11/21/2019   No results found for: IRON, TIBC, FERRITIN   Ref. Range 11/21/2019 11:43  Vitamin D, 25-Hydroxy Latest Ref Range: 30.0 - 100.0 ng/mL 57.0    Attestation Statements:   Reviewed by clinician on day of visit: allergies, medications, problem list, medical history, surgical history, family history, social history, and previous encounter notes.  Time spent on visit including pre-visit chart review and post-visit care was 26 minutes.   I, Doreene Nest, am acting as transcriptionist for Coralie Common, MD.  I have reviewed the above documentation for accuracy and completeness, and I agree with the above. - Ilene Qua, MD

## 2019-12-20 ENCOUNTER — Ambulatory Visit (INDEPENDENT_AMBULATORY_CARE_PROVIDER_SITE_OTHER): Payer: No Typology Code available for payment source | Admitting: Family Medicine

## 2021-02-18 ENCOUNTER — Other Ambulatory Visit: Payer: Self-pay | Admitting: Family Medicine

## 2021-02-18 DIAGNOSIS — Z1231 Encounter for screening mammogram for malignant neoplasm of breast: Secondary | ICD-10-CM

## 2021-05-05 ENCOUNTER — Ambulatory Visit
Admission: RE | Admit: 2021-05-05 | Discharge: 2021-05-05 | Disposition: A | Discharge status: Home / Self Care | Payer: No Typology Code available for payment source | Source: Ambulatory Visit | Attending: Family Medicine | Admitting: Family Medicine

## 2021-05-05 ENCOUNTER — Other Ambulatory Visit: Payer: Self-pay

## 2021-05-05 DIAGNOSIS — Z1231 Encounter for screening mammogram for malignant neoplasm of breast: Secondary | ICD-10-CM

## 2021-05-07 ENCOUNTER — Other Ambulatory Visit: Payer: Self-pay | Admitting: Family Medicine

## 2021-05-08 ENCOUNTER — Other Ambulatory Visit: Payer: Self-pay | Admitting: Family Medicine

## 2021-05-08 DIAGNOSIS — R928 Other abnormal and inconclusive findings on diagnostic imaging of breast: Secondary | ICD-10-CM

## 2021-05-26 ENCOUNTER — Ambulatory Visit
Admission: RE | Admit: 2021-05-26 | Discharge: 2021-05-26 | Disposition: A | Discharge status: Home / Self Care | Payer: No Typology Code available for payment source | Source: Ambulatory Visit | Attending: Family Medicine | Admitting: Family Medicine

## 2021-05-26 ENCOUNTER — Ambulatory Visit: Admission: RE | Admit: 2021-05-26 | Payer: No Typology Code available for payment source | Source: Ambulatory Visit

## 2021-05-26 ENCOUNTER — Ambulatory Visit: Payer: No Typology Code available for payment source

## 2021-05-26 ENCOUNTER — Other Ambulatory Visit: Payer: Self-pay

## 2021-05-26 DIAGNOSIS — R928 Other abnormal and inconclusive findings on diagnostic imaging of breast: Secondary | ICD-10-CM

## 2022-05-19 ENCOUNTER — Encounter (INDEPENDENT_AMBULATORY_CARE_PROVIDER_SITE_OTHER): Payer: Self-pay

## 2022-06-28 ENCOUNTER — Other Ambulatory Visit: Payer: Self-pay | Admitting: Family Medicine

## 2022-06-28 DIAGNOSIS — Z1231 Encounter for screening mammogram for malignant neoplasm of breast: Secondary | ICD-10-CM

## 2022-07-05 ENCOUNTER — Ambulatory Visit
Admission: RE | Admit: 2022-07-05 | Discharge: 2022-07-05 | Disposition: A | Discharge status: Home / Self Care | Payer: No Typology Code available for payment source | Source: Ambulatory Visit | Attending: Family Medicine | Admitting: Family Medicine

## 2022-07-05 DIAGNOSIS — Z1231 Encounter for screening mammogram for malignant neoplasm of breast: Secondary | ICD-10-CM

## 2022-09-01 ENCOUNTER — Encounter (HOSPITAL_BASED_OUTPATIENT_CLINIC_OR_DEPARTMENT_OTHER): Payer: Self-pay

## 2022-09-01 ENCOUNTER — Ambulatory Visit (HOSPITAL_BASED_OUTPATIENT_CLINIC_OR_DEPARTMENT_OTHER): Admit: 2022-09-01 | Payer: No Typology Code available for payment source | Admitting: Obstetrics and Gynecology

## 2022-09-01 SURGERY — DILATATION & CURETTAGE/HYSTEROSCOPY WITH HYDROTHERMAL ABLATION
Anesthesia: Choice

## 2022-09-30 IMAGING — MG DIGITAL DIAGNOSTIC BILAT W/ TOMO W/ CAD
6 of 12 series · 6 of 36 positions shown · non-contrast
Comparison: Previous exam(s).

CLINICAL DATA: Possible bilateral asymmetries on recent screening
mammogram.

EXAM:
DIGITAL DIAGNOSTIC BILATERAL MAMMOGRAM WITH TOMOSYNTHESIS AND CAD
TECHNIQUE: Bilateral digital diagnostic mammography and breast tomosynthesis
was performed. The images were evaluated with computer-aided
detection.

[L CC synth-2D (1 of 3)]
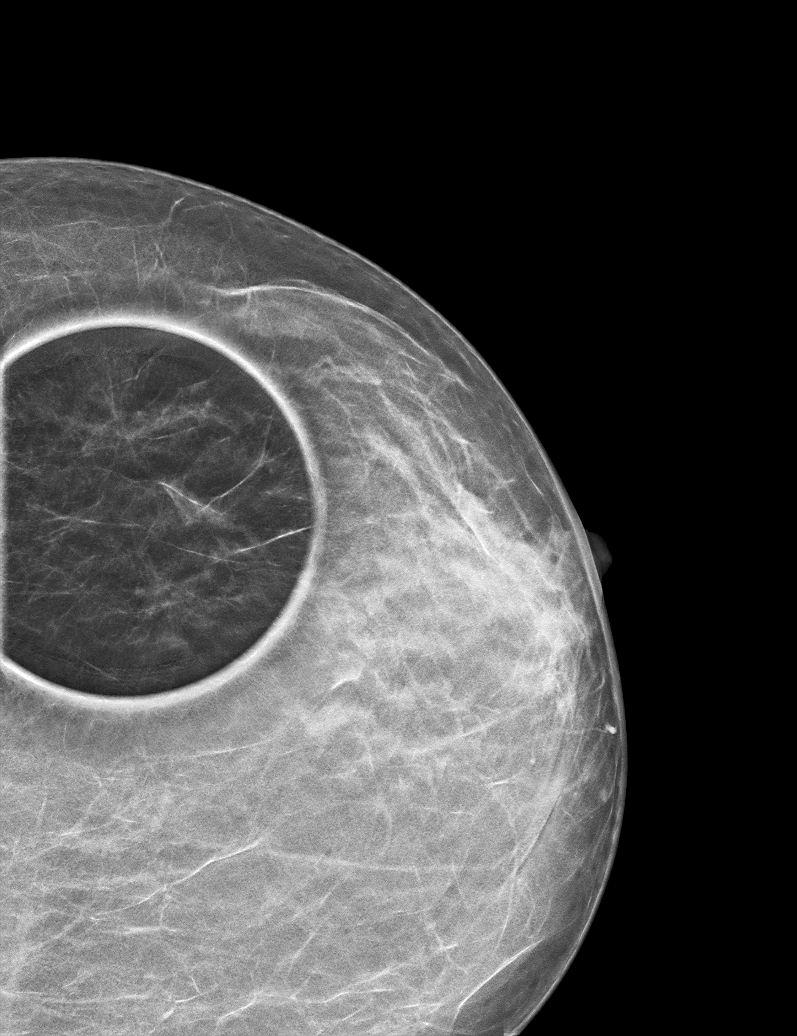

[R MLO synth-2D]
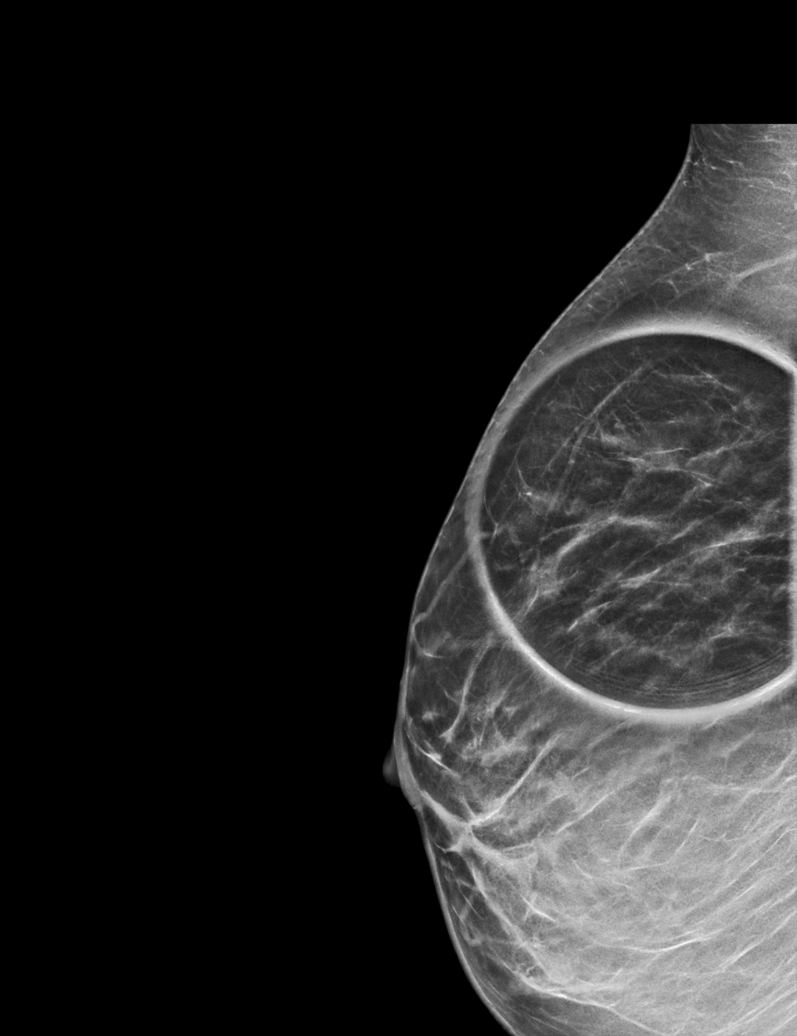

[L CC synth-2D (2 of 3)]
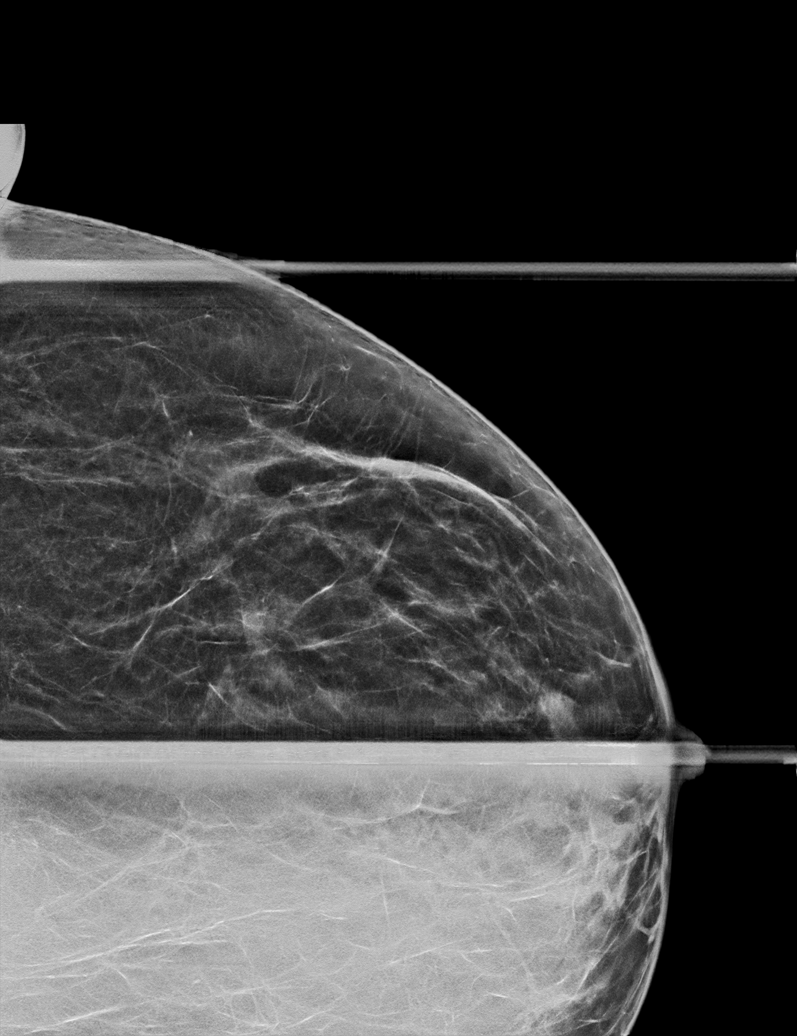

[L CC synth-2D (3 of 3)]
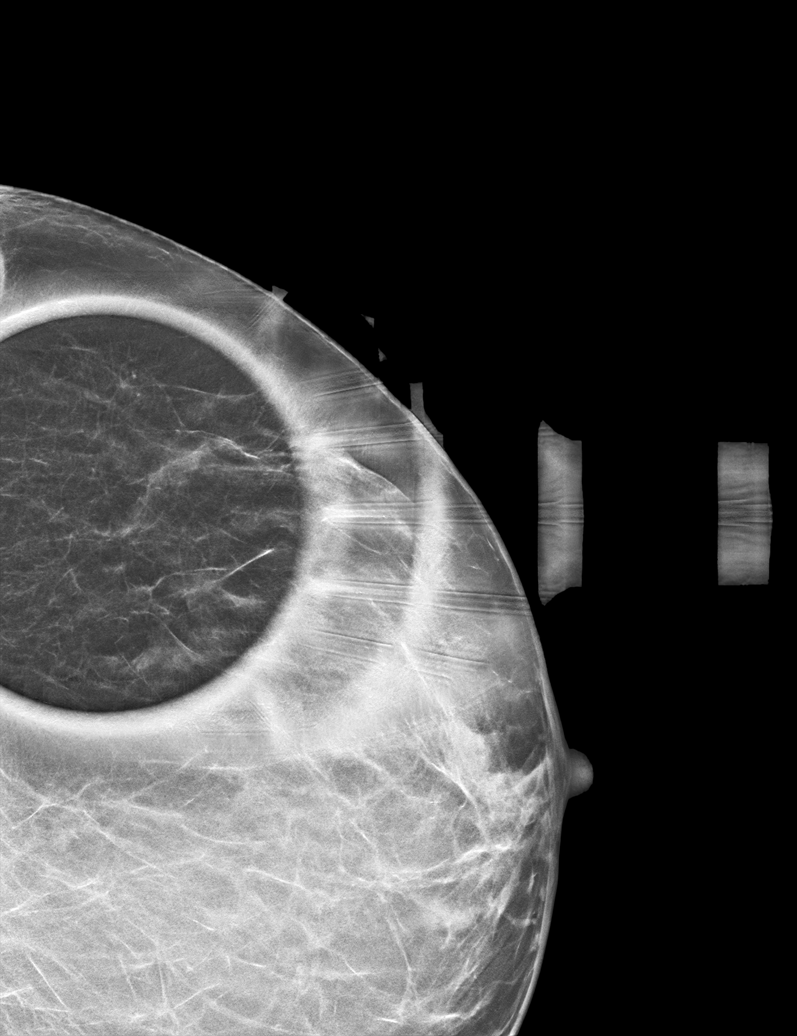

[L ML synth-2D]
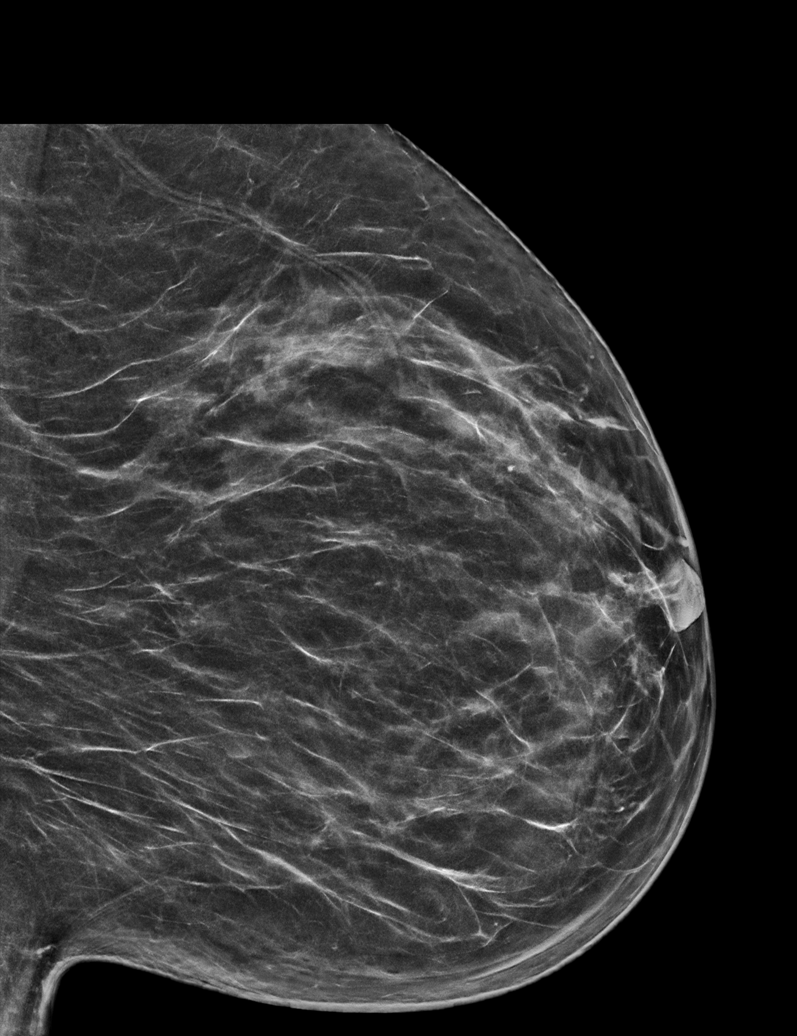

[R CC synth-2D]
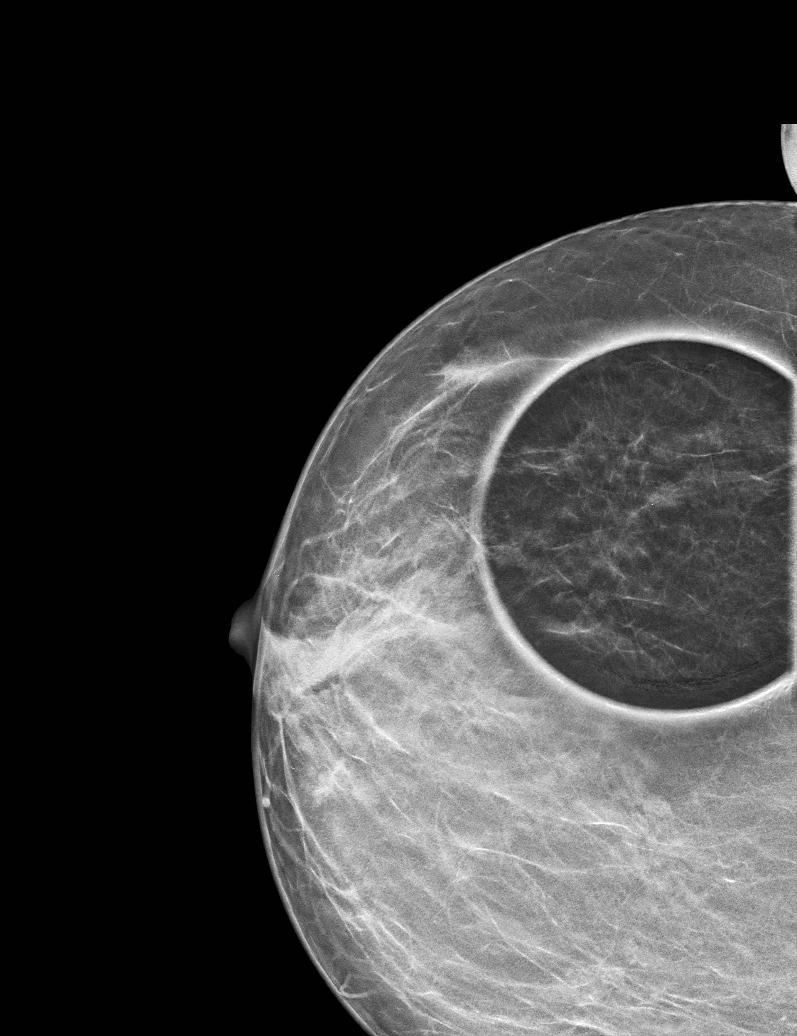

[6 of 36 positions shown; findings below may reference images not displayed]

ACR Breast Density Category b: There are scattered areas of
fibroglandular density.
FINDINGS: The previously noted possible asymmetry in the upper outer right
breast does not persist on additional views, consistent with
overlapping fibroglandular tissue.

The previously noted possible asymmetry in the outer left breast
does not persist on additional views, consistent with overlapping
fibroglandular tissue.
IMPRESSION: No mammographic signs of malignancy in either breast.

RECOMMENDATION:
Annual screening mammogram.

I have discussed the findings and recommendations with the patient.
If applicable, a reminder letter will be sent to the patient
regarding the next appointment.

BI-RADS CATEGORY  1: Negative.
